# Patient Record
Sex: Male | Born: 1985 | Race: Black or African American | Hispanic: No | Marital: Single | Smoking: Never smoker
Health system: Southern US, Community
[De-identification: ages and names within clinical notes are randomized; demographics above are authoritative.]

## PROBLEM LIST (undated history)

## (undated) DIAGNOSIS — E119 Type 2 diabetes mellitus without complications: Secondary | ICD-10-CM

## (undated) DIAGNOSIS — G35 Multiple sclerosis: Secondary | ICD-10-CM

## (undated) DIAGNOSIS — R569 Unspecified convulsions: Secondary | ICD-10-CM

## (undated) DIAGNOSIS — R609 Edema, unspecified: Secondary | ICD-10-CM

## (undated) DIAGNOSIS — I10 Essential (primary) hypertension: Secondary | ICD-10-CM

## (undated) HISTORY — DX: Multiple sclerosis: G35

## (undated) HISTORY — PX: MOUTH SURGERY: SHX715

## (undated) HISTORY — DX: Type 2 diabetes mellitus without complications: E11.9

---
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
## 2003-07-17 ENCOUNTER — Emergency Department (HOSPITAL_COMMUNITY): Admission: EM | Admit: 2003-07-17 | Discharge: 2003-07-17 | Payer: Self-pay | Admitting: Emergency Medicine

## 2003-11-23 ENCOUNTER — Emergency Department (HOSPITAL_COMMUNITY): Admission: EM | Admit: 2003-11-23 | Discharge: 2003-11-23 | Payer: Self-pay | Admitting: Emergency Medicine

## 2004-02-09 ENCOUNTER — Emergency Department (HOSPITAL_COMMUNITY): Admission: EM | Admit: 2004-02-09 | Discharge: 2004-02-09 | Payer: Self-pay | Admitting: *Deleted

## 2004-03-11 ENCOUNTER — Emergency Department (HOSPITAL_COMMUNITY): Admission: EM | Admit: 2004-03-11 | Discharge: 2004-03-12 | Payer: Self-pay | Admitting: Emergency Medicine

## 2004-05-05 ENCOUNTER — Emergency Department (HOSPITAL_COMMUNITY): Admission: EM | Admit: 2004-05-05 | Discharge: 2004-05-05 | Payer: Self-pay | Admitting: Emergency Medicine

## 2004-06-07 ENCOUNTER — Emergency Department (HOSPITAL_COMMUNITY): Admission: EM | Admit: 2004-06-07 | Discharge: 2004-06-08 | Payer: Self-pay | Admitting: Emergency Medicine

## 2004-07-13 ENCOUNTER — Emergency Department (HOSPITAL_COMMUNITY): Admission: EM | Admit: 2004-07-13 | Discharge: 2004-07-13 | Payer: Self-pay | Admitting: Emergency Medicine

## 2004-09-20 ENCOUNTER — Emergency Department (HOSPITAL_COMMUNITY): Admission: EM | Admit: 2004-09-20 | Discharge: 2004-09-20 | Payer: Self-pay | Admitting: Emergency Medicine

## 2005-06-29 ENCOUNTER — Emergency Department (HOSPITAL_COMMUNITY): Admission: EM | Admit: 2005-06-29 | Discharge: 2005-06-29 | Payer: Self-pay | Admitting: Emergency Medicine

## 2005-08-14 ENCOUNTER — Emergency Department (HOSPITAL_COMMUNITY): Admission: EM | Admit: 2005-08-14 | Discharge: 2005-08-14 | Payer: Self-pay | Admitting: Emergency Medicine

## 2007-07-10 ENCOUNTER — Emergency Department (HOSPITAL_COMMUNITY): Admission: EM | Admit: 2007-07-10 | Discharge: 2007-07-10 | Payer: Self-pay | Admitting: Emergency Medicine

## 2012-07-04 ENCOUNTER — Encounter (HOSPITAL_COMMUNITY): Payer: Self-pay | Admitting: *Deleted

## 2012-07-04 ENCOUNTER — Emergency Department (HOSPITAL_COMMUNITY)
Admission: EM | Admit: 2012-07-04 | Discharge: 2012-07-04 | Disposition: A | Discharge status: Home / Self Care | Payer: Medicaid Other | Attending: Emergency Medicine | Admitting: Emergency Medicine

## 2012-07-04 DIAGNOSIS — R569 Unspecified convulsions: Secondary | ICD-10-CM

## 2012-07-04 DIAGNOSIS — Z79899 Other long term (current) drug therapy: Secondary | ICD-10-CM | POA: Insufficient documentation

## 2012-07-04 DIAGNOSIS — I10 Essential (primary) hypertension: Secondary | ICD-10-CM | POA: Insufficient documentation

## 2012-07-04 DIAGNOSIS — G40909 Epilepsy, unspecified, not intractable, without status epilepticus: Secondary | ICD-10-CM | POA: Insufficient documentation

## 2012-07-04 HISTORY — DX: Edema, unspecified: R60.9

## 2012-07-04 HISTORY — DX: Unspecified convulsions: R56.9

## 2012-07-04 HISTORY — DX: Essential (primary) hypertension: I10

## 2012-07-04 LAB — CBC WITH DIFFERENTIAL/PLATELET
Basophils Absolute: 0 10*3/uL (ref 0.0–0.1)
HCT: 45.4 % (ref 39.0–52.0)
Hemoglobin: 14.8 g/dL (ref 13.0–17.0)
Lymphocytes Relative: 58 % — ABNORMAL HIGH (ref 12–46)
Lymphs Abs: 4.9 10*3/uL — ABNORMAL HIGH (ref 0.7–4.0)
Monocytes Absolute: 0.6 10*3/uL (ref 0.1–1.0)
Monocytes Relative: 7 % (ref 3–12)
Neutro Abs: 2.8 10*3/uL (ref 1.7–7.7)
RBC: 5.48 MIL/uL (ref 4.22–5.81)
WBC: 8.6 10*3/uL (ref 4.0–10.5)

## 2012-07-04 LAB — VALPROIC ACID LEVEL: Valproic Acid Lvl: 31.2 ug/mL — ABNORMAL LOW (ref 50.0–100.0)

## 2012-07-04 LAB — BASIC METABOLIC PANEL
CO2: 17 mEq/L — ABNORMAL LOW (ref 19–32)
Chloride: 97 mEq/L (ref 96–112)
Creatinine, Ser: 1.24 mg/dL (ref 0.50–1.35)

## 2012-07-04 MED ORDER — DIVALPROEX SODIUM 500 MG PO DR TAB: 500.0000 mg | DELAYED_RELEASE_TABLET | Freq: Three times a day (TID) | ORAL | Status: DC

## 2012-07-04 NOTE — ED Provider Notes (Signed)
History  This chart was scribed for Donnetta Hutching, MD by Erskine Emery, ED Scribe. This patient was seen in room APA02/APA02 and the patient's care was started at 15:48.   CSN: [id number]  Arrival date & time 07/04/12  1538   First MD Initiated Contact with Patient 07/04/12 1548      Chief Complaint  Patient presents with  . Seizures    (Consider location/radiation/quality/duration/timing/severity/associated sxs/prior treatment) The history is provided by the patient and the spouse. No language interpreter was used.  Martin Stevenson is a 26 y.o. male who presents to the Emergency Department complaining of a seizure this afternoon, while sitting, witnessed by his father. Pt reports he is feeling sleepy now but is otherwise fine. Pt denies any rceent sickness or drinking alcohol. Pt has had a seizure disorder since he was 26 years old and has had about 15 seizures in the last 6 months. Pt was taking Keppra but was switched off it and on to Depakote about 2 months ago. 3 weeks ago the pt was admitted at [facility] for 3 days, where they put him on Depakote, which he takes x4/day, and another medication (the name of which he cannot remember). Pt has no other medical conditions. 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 Pt does not drink alcohol or smoke.   Pt's PCP is Dr. Robynn Pane in [place] and Dr. Gerilyn Pilgrim is the pt's neurologist. Pt has has an appointment on December 3rd with a new neurologist in [place] because the tests he is doing with Dr. Gerilyn Pilgrim are not revealing anything.  Past Medical History  Diagnosis Date  . Seizures   . Hypertension   . Fluid retention     Past Surgical History  Procedure Date  . Mouth surgery     No family history on file.  History  Substance Use Topics  . Smoking status: Never Smoker   . Smokeless tobacco: Not on file  . Alcohol Use: No      Review of Systems A complete 10 system review of  systems was obtained and all systems are negative except as noted in the HPI and PMH.    Allergies  Review of patient's allergies indicates no known allergies.  Home Medications  No current outpatient prescriptions on file.  Triage Vitals: BP 169/87  Temp 98.6 F (37 C) (Oral)  Resp 24  Ht 5\' 7"  (1.702 m)  Wt 218 lb (98.884 kg)  BMI 34.14 kg/m2  SpO2 100%  Physical Exam  Nursing note and vitals reviewed. Constitutional: He is oriented to person, place, and time. He appears well-developed and well-nourished.  HENT:  Head: Normocephalic and atraumatic.  Eyes: Conjunctivae normal and EOM are normal. Pupils are equal, round, and reactive to light.  Neck: Normal range of motion. Neck supple.  Cardiovascular: Normal rate, regular rhythm and normal heart sounds.   Pulmonary/Chest: Effort normal and breath sounds normal.  Abdominal: Soft. Bowel sounds are normal.  Musculoskeletal: Normal range of motion.  Neurological: He is alert and oriented to person, place, and time.  Skin: Skin is warm and dry.  Psychiatric: He has a normal mood and affect.    ED Course  Procedures (including critical care time) DIAGNOSTIC STUDIES: Oxygen Saturation is 100% on room air, normal by my interpretation.    COORDINATION OF CARE: 16:20--I evaluated the patient and we discussed a treatment plan including lab work to evaluate his Depakote levels, to which the pt and  his wife agreed. I told the pt we would call [facility] to get the name of the other medication he was put on.  Results for orders placed during the hospital encounter of 07/04/12  VALPROIC ACID LEVEL      Component Value Range   Valproic Acid Lvl 31.2 (*) 50.0 - 100.0 ug/mL  CBC WITH DIFFERENTIAL      Component Value Range   WBC 8.6  4.0 - 10.5 K/uL   RBC 5.48  4.22 - 5.81 MIL/uL   Hemoglobin 14.8  13.0 - 17.0 g/dL   HCT 16.1  09.6 - 04.5 %   MCV 82.8  78.0 - 100.0 fL   MCH 27.0  26.0 - 34.0 pg   MCHC 32.6  30.0 - 36.0 g/dL   RDW  40.9  81.1 - 91.4 %   Platelets 242  150 - 400 K/uL   Neutrophils Relative 33 (*) 43 - 77 %   Neutro Abs 2.8  1.7 - 7.7 K/uL   Lymphocytes Relative 58 (*) 12 - 46 %   Lymphs Abs 4.9 (*) 0.7 - 4.0 K/uL   Monocytes Relative 7  3 - 12 %   Monocytes Absolute 0.6  0.1 - 1.0 K/uL   Eosinophils Relative 3  0 - 5 %   Eosinophils Absolute 0.2  0.0 - 0.7 K/uL   Basophils Relative 0  0 - 1 %   Basophils Absolute 0.0  0.0 - 0.1 K/uL  BASIC METABOLIC PANEL      Component Value Range   Sodium 139  135 - 145 mEq/L   Potassium 3.4 (*) 3.5 - 5.1 mEq/L   Chloride 97  96 - 112 mEq/L   CO2 17 (*) 19 - 32 mEq/L   Glucose, Bld 126 (*) 70 - 99 mg/dL   BUN 14  6 - 23 mg/dL   Creatinine, Ser 7.82  0.50 - 1.35 mg/dL   Calcium 9.9  8.4 - 95.6 mg/dL   GFR calc non Af Amer 78 (*) >90 mL/min   GFR calc Af Amer >90  >90 mL/min       No diagnosis found.    MDM  Depakote dose is subtherapeutic. Patient is presently taking 500 mg 4 times a day. Will increase to 1000 mg on 1st and 2nd dose in day; and  500 mg dose #3 and #4 per day.  No seizure activity in emergency department     I personally performed the services described in this documentation, which was scribed in my presence. The recorded information has been reviewed and is accurate.    Donnetta Hutching, MD 07/04/12 1807

## 2012-07-04 NOTE — ED Notes (Signed)
Seizure today witnessed by pt's father.  Family reports pt was in sitting position at time of seizure.  Pt slightly postictal, moaning.  Able to follow commands.  C/o headache.

## 2012-07-04 NOTE — ED Notes (Signed)
Pt given ginger ale.

## 2013-01-17 ENCOUNTER — Ambulatory Visit: Payer: Self-pay | Admitting: Diagnostic Neuroimaging

## 2017-09-28 ENCOUNTER — Ambulatory Visit: Payer: Medicaid Other | Admitting: Neurology

## 2017-09-28 ENCOUNTER — Encounter: Payer: Self-pay | Admitting: Neurology

## 2017-09-28 VITALS — BP 124/86 | HR 78 | Ht 67.0 in | Wt 215.0 lb

## 2017-09-28 DIAGNOSIS — Z5181 Encounter for therapeutic drug level monitoring: Secondary | ICD-10-CM

## 2017-09-28 DIAGNOSIS — R569 Unspecified convulsions: Secondary | ICD-10-CM

## 2017-09-28 MED ORDER — LEVETIRACETAM 750 MG PO TABS: 750.0000 mg | ORAL_TABLET | Freq: Two times a day (BID) | ORAL | 3 refills | Status: DC

## 2017-09-28 NOTE — Patient Instructions (Signed)
   We will get an EEG study and we will increase the Keppra to 750 mg twice a day.

## 2017-09-28 NOTE — Progress Notes (Signed)
Reason for visit: Seizures  Referring physician: Dr. Karenann Cai Mancinas is a 32 y.o. male  History of present illness:  Martin Stevenson is a 32 year old right-handed black male with a history of seizures since he was 32 years old.  The patient denies any prior history of head trauma, he indicates that he has 2 maternal first cousins who also have seizures.  The patient indicates that his seizures have never been under good control, he has only gone about 6 weeks without a seizure.  He oftentimes has seizures every week.  The patient indicates that he may get a sensation of dj vu before the onset of the seizure and then he will either black out and jerk on all fours with tongue biting or bowel and bladder incontinence or he will have an episode where he will start cussing and give gestures with his arms.  The patient may have a headache following the event he may feel fatigued.  He does report some hand numbness but otherwise no numbness of extremities, he denies weakness of the extremities.  He has some slight gait instability, and he could not tolerate Dilantin because he became quite staggery on the medication.  He denies issues controlling the bowels or the bladder.  He is not sleeping well at night, he takes melatonin but he still does not rest well.  He claims that he did have MRI evaluation of the brain done at [facility].  He does not operate a motor vehicle.  He is currently not employed.  He is sent to this office for an evaluation.  He is on Keppra 500 mg twice daily and Depakote 500 mg daily.  Past Medical History:  Diagnosis Date  . Diabetes (HCC)   . Fluid retention   . Hypertension   . Seizures (HCC)     Past Surgical History:  Procedure Laterality Date  . MOUTH SURGERY     x3, jaw, hip, chin    Family History  Problem Relation Age of Onset  . Diabetes Mother   . Pulmonary embolism Mother        hx of blood clots  . Diabetes Father     Social history:   reports that  has never smoked. he has never used smokeless tobacco. He reports that he does not drink alcohol or use drugs.  Medications:  Prior to Admission medications   Medication Sig Start Date End Date Taking? Authorizing Provider  divalproex (DEPAKOTE) 500 MG DR tablet Take 1 tablet (500 mg total) by mouth 3 (three) times daily. Patient taking differently: Take 500 mg by mouth daily.  07/04/12  Yes Donnetta Hutching, MD  sitaGLIPtin-metformin (JANUMET) 50-1000 MG tablet Take 1 tablet by mouth 2 (two) times daily with a meal.   Yes [provider]  levETIRAcetam (KEPPRA) 750 MG tablet Take 1 tablet (750 mg total) by mouth 2 (two) times daily. 09/28/17   York Spaniel, MD     No Known Allergies  ROS:  Out of a complete 14 system review of symptoms, the patient complains only of the following symptoms, and all other reviewed systems are negative.  Snoring Diarrhea Moles Increased thirst, joint pain Dizziness, seizures Not enough sleep, racing thoughts Sleepiness  Blood pressure 124/86, pulse 78, height 5\' 7"  (1.702 m), weight 215 lb (97.5 kg).  Physical Exam  General: The patient is alert and cooperative at the time of the examination.  The patient is moderately obese.  Eyes: Pupils are equal,  round, and reactive to light. Discs are flat bilaterally.  Neck: The neck is supple, no carotid bruits are noted.  Respiratory: The respiratory examination is clear.  Cardiovascular: The cardiovascular examination reveals a regular rate and rhythm, no obvious murmurs or rubs are noted.  Skin: Extremities are without significant edema.  Neurologic Exam  Mental status: The patient is alert and oriented x 3 at the time of the examination. The patient has apparent normal recent and remote memory, with an apparently normal attention span and concentration ability.  Cranial nerves: Facial symmetry is present. There is good sensation of the face to pinprick and soft touch  bilaterally. The strength of the facial muscles and the muscles to head turning and shoulder shrug are normal bilaterally. Speech is well enunciated, no aphasia or dysarthria is noted. Extraocular movements are full. Visual fields are full. The tongue is midline, and the patient has symmetric elevation of the soft palate. No obvious hearing deficits are noted.  Motor: The motor testing reveals 5 over 5 strength of all 4 extremities. Good symmetric motor tone is noted throughout.  Sensory: Sensory testing is intact to pinprick, soft touch, vibration sensation, and position sense on all 4 extremities. No evidence of extinction is noted.  Coordination: Cerebellar testing reveals good finger-nose-finger and heel-to-shin bilaterally.  Gait and station: Gait is normal. Tandem gait is normal. Romberg is negative. No drift is seen.  Reflexes: Deep tendon reflexes are symmetric and normal bilaterally. Toes are downgoing bilaterally.   Assessment/Plan:  1.  Intractable seizure disorder  The patient continues to have very frequent seizures, he is on minimal doses of medication including Keppra and Depakote.  The patient will have blood work done today, he will have the Keppra dose increased to 750 mg twice daily, continue on the Depakote.  The patient will have an EEG study.  We will try to get the MRI brain result done at [facility].  Video EEG monitoring study may be done in the future if his seizures cannot come under good control.  He will follow-up in 3 months.  Marlan Palau MD 09/28/2017 2:28 PM  [facility] [address] [place], [place] [address]  Phone [phone] Fax [phone]

## 2017-09-29 ENCOUNTER — Telehealth: Payer: Self-pay | Admitting: Neurology

## 2017-09-29 DIAGNOSIS — R569 Unspecified convulsions: Secondary | ICD-10-CM

## 2017-09-29 LAB — COMPREHENSIVE METABOLIC PANEL
A/G RATIO: 1.4 (ref 1.2–2.2)
ALBUMIN: 4.3 g/dL (ref 3.5–5.5)
ALT: 23 IU/L (ref 0–44)
AST: 17 IU/L (ref 0–40)
Alkaline Phosphatase: 47 IU/L (ref 39–117)
BUN / CREAT RATIO: 14 (ref 9–20)
BUN: 12 mg/dL (ref 6–20)
Bilirubin Total: <0.2 mg/dL (ref 0.0–1.2)
CALCIUM: 9.6 mg/dL (ref 8.7–10.2)
CO2: 22 mmol/L (ref 20–29)
Chloride: 104 mmol/L (ref 96–106)
Creatinine, Ser: 0.85 mg/dL (ref 0.76–1.27)
GFR calc Af Amer: 132 mL/min/{1.73_m2} (ref >59)
GFR calc non Af Amer: 114 mL/min/{1.73_m2} (ref >59)
GLOBULIN, TOTAL: 3.1 g/dL (ref 1.5–4.5)
Glucose: 117 mg/dL — ABNORMAL HIGH (ref 65–99)
POTASSIUM: 4.4 mmol/L (ref 3.5–5.2)
SODIUM: 142 mmol/L (ref 134–144)
Total Protein: 7.4 g/dL (ref 6.0–8.5)

## 2017-09-29 LAB — CBC WITH DIFFERENTIAL/PLATELET
Basophils Absolute: 0 10*3/uL (ref 0.0–0.2)
Basos: 0 %
EOS (ABSOLUTE): 0.1 10*3/uL (ref 0.0–0.4)
Eos: 1 %
Hematocrit: 41.5 % (ref 37.5–51.0)
Hemoglobin: 13.8 g/dL (ref 13.0–17.7)
Immature Grans (Abs): 0 10*3/uL (ref 0.0–0.1)
Immature Granulocytes: 0 %
LYMPHS ABS: 2.6 10*3/uL (ref 0.7–3.1)
Lymphs: 51 %
MCH: 26.7 pg (ref 26.6–33.0)
MCHC: 33.3 g/dL (ref 31.5–35.7)
MCV: 80 fL (ref 79–97)
MONOS ABS: 0.4 10*3/uL (ref 0.1–0.9)
Monocytes: 7 %
NEUTROS ABS: 2.1 10*3/uL (ref 1.4–7.0)
NEUTROS PCT: 41 %
PLATELETS: 241 10*3/uL (ref 150–379)
RBC: 5.16 x10E6/uL (ref 4.14–5.80)
RDW: 14.8 % (ref 12.3–15.4)
WBC: 5.3 10*3/uL (ref 3.4–10.8)

## 2017-09-29 LAB — AMMONIA: Ammonia: 73 ug/dL (ref 27–102)

## 2017-09-29 LAB — LEVETIRACETAM LEVEL: Levetiracetam Lvl: 8.1 ug/mL — ABNORMAL LOW (ref 10.0–40.0)

## 2017-09-29 LAB — VALPROIC ACID LEVEL: Valproic Acid Lvl: 39 ug/mL — ABNORMAL LOW (ref 50–100)

## 2017-09-29 NOTE — Telephone Encounter (Signed)
I called [facility] back. Advised we would like CT head report. She needs [place] to re-send release form asking for the CT head.

## 2017-09-29 NOTE — Telephone Encounter (Signed)
I called the patient.  The blood work is unremarkable except that the Keppra level and the Depakote level are both low.  The Keppra dose was just recently increased, the patient continues to have seizures, he will call back and we will increase the Depakote dosing as well.  [facility] records indicate that the patient has never had an MRI of the brain.  The patient will be set up for MRI with and without gadolinium enhancement.

## 2017-09-29 NOTE — Telephone Encounter (Signed)
[facility] [phone] called she rec'd medical release for MRI report. Patient's not had MRI just a CT head 12/04/14, abdomen and pelvis on 08/18/17. Please call to advise

## 2017-09-29 NOTE — Telephone Encounter (Signed)
Recall to get MRI results from your head hospital, the patient apparently has never had an MRI done of the brain as he indicated that he did.  The patient had CT scan of the brain that was done on 04 December 2014.  This study was normal.  The study was done without contrast only.

## 2017-10-07 ENCOUNTER — Other Ambulatory Visit: Payer: Self-pay

## 2017-10-07 ENCOUNTER — Other Ambulatory Visit: Payer: Medicaid Other

## 2017-10-14 ENCOUNTER — Other Ambulatory Visit: Payer: Self-pay

## 2017-10-14 ENCOUNTER — Ambulatory Visit
Admission: RE | Admit: 2017-10-14 | Discharge: 2017-10-14 | Disposition: A | Discharge status: Home / Self Care | Payer: Medicaid Other | Source: Ambulatory Visit | Attending: Neurology | Admitting: Neurology

## 2017-10-14 DIAGNOSIS — R569 Unspecified convulsions: Secondary | ICD-10-CM

## 2017-10-14 MED ORDER — GADOBENATE DIMEGLUMINE 529 MG/ML IV SOLN
20.0000 mL | Freq: Once | INTRAVENOUS | Status: AC | PRN
  Administered 2017-10-14: 20 mL via INTRAVENOUS

## 2017-10-15 ENCOUNTER — Telehealth: Payer: Self-pay | Admitting: Neurology

## 2017-10-15 DIAGNOSIS — G35 Multiple sclerosis: Secondary | ICD-10-CM

## 2017-10-15 NOTE — Telephone Encounter (Signed)
I called the patient.  MRI of the brain shows findings that are typical for multiple sclerosis.  Thing to do with his seizures per se.  The patient will be set up for MRI of the cervical spine, if this is unremarkable, the patient will go for lumbar puncture.   MRI brain 10/15/17:  IMPRESSION:   Abnormal MRI brain (with and without) demonstrating: 1. There are 6 ovoid and round periventricular and subcortical T2 hyperintense lesions in the right frontal and bilateral parietal regions. Considerations include demyelinating, autoimmune, inflammatory, post-infectious or chronic ischemic etiologies.  2. No abnormal enhancing lesions. 3. No acute findings.

## 2017-10-19 ENCOUNTER — Telehealth: Payer: Self-pay | Admitting: Neurology

## 2017-10-19 NOTE — Telephone Encounter (Signed)
Mri order was sent to [facility]. It's [employer] [facility] obtains the authorization.

## 2017-10-20 ENCOUNTER — Other Ambulatory Visit: Payer: Medicaid Other

## 2017-10-20 ENCOUNTER — Encounter: Payer: Self-pay | Admitting: Neurology

## 2017-10-28 ENCOUNTER — Ambulatory Visit
Admission: RE | Admit: 2017-10-28 | Discharge: 2017-10-28 | Disposition: A | Discharge status: Home / Self Care | Payer: Medicaid Other | Source: Ambulatory Visit | Attending: Neurology | Admitting: Neurology

## 2017-10-28 DIAGNOSIS — G35 Multiple sclerosis: Secondary | ICD-10-CM | POA: Diagnosis not present

## 2017-10-28 MED ORDER — GADOBENATE DIMEGLUMINE 529 MG/ML IV SOLN
20.0000 mL | Freq: Once | INTRAVENOUS | Status: AC | PRN
  Administered 2017-10-28: 20 mL via INTRAVENOUS

## 2017-10-29 ENCOUNTER — Telehealth: Payer: Self-pay | Admitting: Neurology

## 2017-10-29 DIAGNOSIS — R9089 Other abnormal findings on diagnostic imaging of central nervous system: Secondary | ICD-10-CM

## 2017-10-29 NOTE — Telephone Encounter (Signed)
I called the patient.  The MRI of the cervical spine is completely normal.  Given the abnormalities seen by MRI of the brain, we will set up a lumbar puncture.  I will also do a visual evoked response test looking for changes consistent with multiple sclerosis.     MRI cervical 10/29/17:  IMPRESSION:   Normal MRI cervical spine (with and without).  No intrinsic, compressive or abnormal enhancing spinal cord lesions.

## 2017-11-03 ENCOUNTER — Telehealth: Payer: Self-pay | Admitting: *Deleted

## 2017-11-03 ENCOUNTER — Ambulatory Visit
Admission: RE | Admit: 2017-11-03 | Discharge: 2017-11-03 | Disposition: A | Discharge status: Home / Self Care | Payer: Medicaid Other | Source: Ambulatory Visit | Attending: Neurology | Admitting: Neurology

## 2017-11-03 VITALS — BP 134/77 | HR 72

## 2017-11-03 DIAGNOSIS — R569 Unspecified convulsions: Secondary | ICD-10-CM

## 2017-11-03 DIAGNOSIS — R9089 Other abnormal findings on diagnostic imaging of central nervous system: Secondary | ICD-10-CM

## 2017-11-03 NOTE — Telephone Encounter (Signed)
Received results via fax. Gave to CW,MD to review.

## 2017-11-03 NOTE — Telephone Encounter (Signed)
Preliminary spinal fluid results have been reviewed.  The patient is undergoing a demyelinating disease workup.

## 2017-11-03 NOTE — Discharge Instructions (Signed)

## 2017-11-03 NOTE — Progress Notes (Signed)
One SST tube of blood drawn from right [facility] space for LP labs without difficulty; site unremarkable.

## 2017-11-03 NOTE — Telephone Encounter (Signed)
Took call from phone staff. Spoke with Thereasa Distance w/ [facility]. He will fax results for stat labs to [phone]. Advised once received, I will give to Dr. Anne Hahn to review. He verbalize understanding and should be faxed to [place] within 10-15 min

## 2017-11-13 ENCOUNTER — Telehealth: Payer: Self-pay | Admitting: Neurology

## 2017-11-13 DIAGNOSIS — R9089 Other abnormal findings on diagnostic imaging of central nervous system: Secondary | ICD-10-CM

## 2017-11-13 LAB — ANGIOTENSIN CONVERTING ENZYME, CSF: ACE, CSF: 9 U/L (ref <=15)

## 2017-11-13 LAB — CSF CELL COUNT WITH DIFFERENTIAL
RBC COUNT CSF: 0 {cells}/uL (ref 0–10)
WBC, CSF: 4 cells/uL (ref 0–5)

## 2017-11-13 LAB — OLIGOCLONAL BANDS, CSF + SERM

## 2017-11-13 LAB — GLUCOSE, CSF: Glucose, CSF: 65 mg/dL (ref 40–80)

## 2017-11-13 LAB — PROTEIN, CSF: TOTAL PROTEIN, CSF: 22 mg/dL (ref 15–45)

## 2017-11-13 LAB — VDRL, CSF: SYPHILIS VDRL QUANT CSF: NONREACTIVE

## 2017-11-13 NOTE — Telephone Encounter (Signed)
I called the patient.  The spinal fluid results show 5 oligoclonal bands but these bands are also present in the peripheral blood.  This is not diagnostic of multiple sclerosis.  I will have the patient come in for further blood work.  We will need to follow the MRI of the brain, we will recheck in 6 months.

## 2017-11-13 NOTE — Telephone Encounter (Signed)
I called the pathologist who read the spinal fluid analysis.  It appears that the patient has both evidence of greater than 5 oligoclonal bands produced only within the central nervous system, but he also has additional bands that are both in the peripheral blood and CNS.  The above findings could be consistent with multiple sclerosis therefore.  We will get the blood work, we may consider initiation of treatment for multiple sclerosis with this patient.

## 2017-11-30 ENCOUNTER — Encounter: Payer: Self-pay | Admitting: Neurology

## 2017-11-30 ENCOUNTER — Other Ambulatory Visit: Payer: Medicaid Other

## 2017-12-16 ENCOUNTER — Encounter: Payer: Self-pay | Admitting: Neurology

## 2017-12-18 ENCOUNTER — Other Ambulatory Visit: Payer: Medicaid Other

## 2017-12-21 ENCOUNTER — Ambulatory Visit: Payer: Medicaid Other | Admitting: Neurology

## 2017-12-21 ENCOUNTER — Telehealth: Payer: Self-pay | Admitting: Neurology

## 2017-12-21 DIAGNOSIS — R569 Unspecified convulsions: Secondary | ICD-10-CM | POA: Diagnosis not present

## 2017-12-21 NOTE — Telephone Encounter (Signed)
I called the patient.  The EEG study is very abnormal, shows significant irritability emanating from the right frontotemporal area.   If this seizures cannot be controlled with medications alone, this patient could potentially be an excellent candidate for epilepsy surgery.  His MRI of the brain however shows changes that are consistent with multiple sclerosis.  He will be seen in the office next week, we will discuss initiation of treatment for multiple sclerosis, lumbar puncture findings were consistent with a diagnosis of MS as well.

## 2017-12-21 NOTE — Procedures (Signed)
     History: Martin Stevenson is a 32 year old gentleman with a history of seizures since he was 32 years old.  The patient has intractable seizures, never going more than 6 weeks without an event.  The patient has a sensation of dj vu before onset of seizure and he will black out and jerk on all fours.  The patient is being evaluated for these episodes.  This is a routine EEG.  No skull defects are noted.  Medications include Depakote, Janumet, and Keppra.  EEG classification: Dysrhythmia grade 3 right frontotemporal  Description of the recording: The background rhythms of this recording consists of a well modulated medium amplitude alpha rhythm of 11 Hz that is reactive to eye opening and closure.  As the record progresses, photic stimulation is performed, this resulted in a minimal but bilateral photic driving response.  Hyperventilation is not performed.  Throughout the recording, frequent sharp transients are seen emanating from the right frontotemporal area primarily, occasional spike-wave discharges are noted frontally on the right.  An electrographic seizure was not recorded.  EKG monitor shows no evidence of cardiac rhythm abnormalities with a heart rate of 66.  Impression: This is an abnormal EEG recording secondary to sharp wave activity and spike wave activity emanating from the right frontotemporal region.  This study suggests the diagnosis of epilepsy, it suggest that the focus is in the right brain.  No electrographic seizures were seen.

## 2017-12-22 ENCOUNTER — Telehealth: Payer: Self-pay

## 2017-12-22 ENCOUNTER — Telehealth: Payer: Self-pay | Admitting: Neurology

## 2017-12-22 NOTE — Telephone Encounter (Signed)
Called patient multiple times to r/s VER testing. Patient's voicemail was full so I couldn't leave him a voicemail to r/s. Sent patient a r/s letter.

## 2017-12-22 NOTE — Telephone Encounter (Signed)
Error

## 2017-12-29 ENCOUNTER — Encounter: Payer: Self-pay | Admitting: Neurology

## 2017-12-29 ENCOUNTER — Ambulatory Visit: Payer: Medicaid Other | Admitting: Neurology

## 2017-12-29 VITALS — BP 129/85 | HR 80 | Ht 67.0 in | Wt 207.0 lb

## 2017-12-29 DIAGNOSIS — G35 Multiple sclerosis: Secondary | ICD-10-CM | POA: Diagnosis not present

## 2017-12-29 DIAGNOSIS — G35D Multiple sclerosis, unspecified: Secondary | ICD-10-CM

## 2017-12-29 DIAGNOSIS — R569 Unspecified convulsions: Secondary | ICD-10-CM

## 2017-12-29 DIAGNOSIS — Z5181 Encounter for therapeutic drug level monitoring: Secondary | ICD-10-CM | POA: Diagnosis not present

## 2017-12-29 HISTORY — DX: Multiple sclerosis: G35

## 2017-12-29 HISTORY — DX: Multiple sclerosis, unspecified: G35.D

## 2017-12-29 MED ORDER — DIVALPROEX SODIUM 500 MG PO DR TAB: 500.0000 mg | DELAYED_RELEASE_TABLET | Freq: Two times a day (BID) | ORAL | 1 refills | Status: DC

## 2017-12-29 NOTE — Progress Notes (Signed)
Placed [facility] lab in [facility] lock box for routine pick up. Also faxed completed/signed start form for Tecfidera to [employer] at [phone]. Received fax confirmation.

## 2017-12-29 NOTE — Progress Notes (Signed)
Reason for visit: Seizures, multiple sclerosis Mr. Martin Stevenson is a 32 year old  Martin Stevenson is an 32 y.o. male  History of present illness:  Mr. Martin Stevenson is a 32 year old right-handed black male with a history of intractable seizures.  The patient has been on low-dose Depakote taking only 500 mg daily, he takes Keppra 750 mg twice daily.  He tolerates the medications well, but his seizures have not been under full control.  He is not able to operate a motor vehicle, he has not been able to maintain gainful employment because of the seizures.  The patient last had a seizure about 1 week prior to this visit.  Usually after the seizure he has soreness of the left shoulder and arm.  He is also noted some achy sensations in both legs that is better with walking, and is worse on the right leg, he claims that this started following the lumbar puncture.  The patient denies any problems controlling the bowels or the bladder, he has not had any balance issues or falls.  The patient returns to this office for an evaluation.  When last seen, MRI of the brain was done and showed lesions consistent with multiple sclerosis, lumbar puncture showed oligoclonal banding consistent with a diagnosis of MS.  MRI of the cervical spine did not show cord lesions, a visual evoked response test was ordered but has not yet been done.  The patient is not yet on any disease modifying agents for multiple sclerosis.  Past Medical History:  Diagnosis Date  . Diabetes (HCC)   . Fluid retention   . Hypertension   . Seizures (HCC)     Past Surgical History:  Procedure Laterality Date  . MOUTH SURGERY     x3, jaw, hip, chin    Family History  Problem Relation Age of Onset  . Diabetes Mother   . Pulmonary embolism Mother        hx of blood clots  . Diabetes Father     Social history:  reports that he has never smoked. He has never used smokeless tobacco. He reports that he does not drink alcohol or use drugs.   No Known  Allergies  Medications:  Prior to Admission medications   Medication Sig Start Date End Date Taking? Authorizing Provider  divalproex (DEPAKOTE) 500 MG DR tablet Take 1 tablet (500 mg total) by mouth 3 (three) times daily. Patient taking differently: Take 500 mg by mouth daily.  07/04/12  Yes Donnetta Hutching, MD  levETIRAcetam (KEPPRA) 750 MG tablet Take 1 tablet (750 mg total) by mouth 2 (two) times daily. 09/28/17  Yes York Spaniel, MD  sitaGLIPtin-metformin (JANUMET) 50-1000 MG tablet Take 1 tablet by mouth 2 (two) times daily with a meal.   Yes [provider]    ROS:  Out of a complete 14 system review of symptoms, the patient complains only of the following symptoms, and all other reviewed systems are negative.  Joint pain, joint swelling, walking difficulty Seizures  There were no vitals taken for this visit.  Physical Exam  General: The patient is alert and cooperative at the time of the examination.  Skin: No significant peripheral edema is noted.   Neurologic Exam  Mental status: The patient is alert and oriented x 3 at the time of the examination. The patient has apparent normal recent and remote memory, with an apparently normal attention span and concentration ability.   Cranial nerves: Facial symmetry is present. Speech is normal, no  aphasia or dysarthria is noted. Extraocular movements are full. Visual fields are full.  Pupils are equal, round, and reactive to light.  Discs are flat bilaterally.  Motor: The patient has good strength in all 4 extremities.  Sensory examination: Soft touch sensation is symmetric on the face, arms, and legs.  Coordination: The patient has good finger-nose-finger and heel-to-shin bilaterally.  Gait and station: The patient has a normal gait. Tandem gait is normal. Romberg is negative. No drift is seen.  Reflexes: Deep tendon reflexes are symmetric.   MRI brain 10/15/17:  IMPRESSION:   Abnormal MRI brain (with and  without) demonstrating: 1. There are 6 ovoid and round periventricular and subcortical T2 hyperintense lesions in the right frontal and bilateral parietal regions. Considerations include demyelinating, autoimmune, inflammatory, post-infectious or chronic ischemic etiologies.  2. No abnormal enhancing lesions. 3. No acute findings.  * MRI scan images were reviewed online. I agree with the written report.   MRI cervical 10/29/17:  IMPRESSION:   Normal MRI cervical spine (with and without). No intrinsic, compressive or abnormal enhancing spinal cord lesions.   EEG 12/21/17:  Impression: This is an abnormal EEG recording secondary to sharp wave activity and spike wave activity emanating from the right frontotemporal region.  This study suggests the diagnosis of epilepsy, it suggest that the focus is in the right brain.  No electrographic seizures were seen.    Assessment/Plan:  1.  Intractable seizures  2.  Multiple sclerosis  The patient will have blood work done today.  The Depakote dosing will be increased to 500 mg twice daily, Keppra will be maintained at 750 mg twice daily.  He will be placed on Tecfidera in the next week or 2.  The patient will follow-up in 4 months.  Marlan Palau MD 12/29/2017 11:16 AM  [facility] [address] [place], [place] [address]  Phone [phone] Fax [phone]

## 2018-01-02 LAB — HEPATITIS B CORE ANTIBODY, TOTAL: Hep B Core Total Ab: NEGATIVE

## 2018-01-02 LAB — QUANTIFERON-TB GOLD PLUS
QUANTIFERON NIL VALUE: 0.02 [IU]/mL
QUANTIFERON-TB GOLD PLUS: NEGATIVE
QuantiFERON Mitogen Value: >10 IU/mL
QuantiFERON TB1 Ag Value: 0.05 IU/mL
QuantiFERON TB2 Ag Value: 0.04 IU/mL

## 2018-01-02 LAB — COMPREHENSIVE METABOLIC PANEL
ALBUMIN: 4.5 g/dL (ref 3.5–5.5)
ALK PHOS: 46 IU/L (ref 39–117)
ALT: 18 IU/L (ref 0–44)
AST: 16 IU/L (ref 0–40)
Albumin/Globulin Ratio: 1.6 (ref 1.2–2.2)
BILIRUBIN TOTAL: 0.3 mg/dL (ref 0.0–1.2)
BUN/Creatinine Ratio: 12 (ref 9–20)
BUN: 12 mg/dL (ref 6–20)
CHLORIDE: 106 mmol/L (ref 96–106)
CO2: 23 mmol/L (ref 20–29)
CREATININE: 1.04 mg/dL (ref 0.76–1.27)
Calcium: 9.9 mg/dL (ref 8.7–10.2)
GFR calc Af Amer: 109 mL/min/{1.73_m2} (ref >59)
GFR calc non Af Amer: 94 mL/min/{1.73_m2} (ref >59)
GLUCOSE: 109 mg/dL — AB (ref 65–99)
Globulin, Total: 2.8 g/dL (ref 1.5–4.5)
Potassium: 4.4 mmol/L (ref 3.5–5.2)
Sodium: 145 mmol/L — ABNORMAL HIGH (ref 134–144)
TOTAL PROTEIN: 7.3 g/dL (ref 6.0–8.5)

## 2018-01-02 LAB — CBC WITH DIFFERENTIAL/PLATELET
BASOS ABS: 0 10*3/uL (ref 0.0–0.2)
Basos: 0 %
EOS (ABSOLUTE): 0.1 10*3/uL (ref 0.0–0.4)
EOS: 3 %
HEMATOCRIT: 43.1 % (ref 37.5–51.0)
HEMOGLOBIN: 13.6 g/dL (ref 13.0–17.7)
IMMATURE GRANS (ABS): 0 10*3/uL (ref 0.0–0.1)
IMMATURE GRANULOCYTES: 0 %
Lymphocytes Absolute: 2.2 10*3/uL (ref 0.7–3.1)
Lymphs: 43 %
MCH: 26.1 pg — ABNORMAL LOW (ref 26.6–33.0)
MCHC: 31.6 g/dL (ref 31.5–35.7)
MCV: 83 fL (ref 79–97)
MONOCYTES: 8 %
Monocytes Absolute: 0.4 10*3/uL (ref 0.1–0.9)
Neutrophils Absolute: 2.3 10*3/uL (ref 1.4–7.0)
Neutrophils: 46 %
Platelets: 271 10*3/uL (ref 150–450)
RBC: 5.21 x10E6/uL (ref 4.14–5.80)
RDW: 14.9 % (ref 12.3–15.4)
WBC: 5.1 10*3/uL (ref 3.4–10.8)

## 2018-01-02 LAB — VALPROIC ACID LEVEL: VALPROIC ACID LVL: 38 ug/mL — AB (ref 50–100)

## 2018-01-02 LAB — HEPATITIS C ANTIBODY: Hep C Virus Ab: <0.1 s/co ratio (ref 0.0–0.9)

## 2018-01-02 LAB — LEVETIRACETAM LEVEL: LEVETIRACETAM: 18.5 ug/mL (ref 10.0–40.0)

## 2018-01-02 LAB — VARICELLA ZOSTER ANTIBODY, IGG: VARICELLA: 986 {index} (ref >165)

## 2018-01-05 ENCOUNTER — Telehealth: Payer: Self-pay | Admitting: *Deleted

## 2018-01-05 NOTE — Telephone Encounter (Signed)
-----   Message from York Spaniel, MD sent at 01/04/2018 11:26 AM EDT ----- Blood work is unremarkable, with exception of mildly elevated sodium level, not clinically significant.  Valproic acid level is low, dosing has been increased.  Keppra level is therapeutic.  JC virus antibody level is pending. Please call the patient. ----- Message ----- From: Nell [facility] Lab Results In Sent: 12/30/2017   7:42 AM To: York Spaniel, MD

## 2018-01-05 NOTE — Telephone Encounter (Signed)
Called and spoke w/ patient. Advised labs unremarkable w/ exception of mildly elevated sodium level, not clinically significant. Valproic acid level low, but dose increased. Keppra level in therapeutic range. JCV pending. We will call once those results are in. He verbalized understanding.

## 2018-01-06 ENCOUNTER — Telehealth: Payer: Self-pay | Admitting: Neurology

## 2018-01-06 NOTE — Telephone Encounter (Signed)
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

## 2018-01-14 ENCOUNTER — Telehealth: Payer: Self-pay | Admitting: *Deleted

## 2018-01-14 NOTE — Telephone Encounter (Addendum)
I reached out to [employer] at [phone] and spoke with Denny Peon. They verified they received patient start form for Tecfidera. They spoke w/ pt on 01/01/18 about next steps. It was sent the [facility] on 01/08/18. She does not see any other updates. They will contact the pt to receive an update and see if copay assistance is needed. They will also contact pharmacy to see where they are at and if anything is needed. They will call back to let us know.

## 2018-01-26 NOTE — Telephone Encounter (Signed)
I called [employer] again and spoke with Crystal to receive update on pt receiving rx Tecfidera from specialty pharmacy. States specialty pharmacy ready to shop medication to pt and he has 3.00 copay. They have pt assistance options if needed.   They have been unable to contact him to verify shipment info. They reached out to him 01/18/18, 01/21/18, and 01/25/18. Advised I will try to reach patient to have him call them to set up shipment as well.  I called and LVM (ok per DPR) on [phone] with above information for him to call and set up shipment.

## 2018-01-26 NOTE — Telephone Encounter (Signed)
Noted, thank you

## 2018-01-26 NOTE — Telephone Encounter (Signed)
Pt returned RN's call. Msg relayed, he will call the specialty pharmacy today.

## 2018-01-26 NOTE — Telephone Encounter (Signed)
Tried calling fiance on DPR but went to VM. Did not LVM since no permission to on form.  I tried father on DPR Mal Amabile). Was able to speak with him. He will contact his son to have him call our office to get information re: Tecfidera shipment.   If pt calls, please tell him to call the specialty pharmacy at [phone] to set up shipment for Tecfidera.

## 2018-02-01 NOTE — Telephone Encounter (Signed)
Received fax transmission from [employer] stating, "[facility] has confirmed that your patient's Tecfidera prescription has been shipped."   Phone: [phone] for any questions or concerns.

## 2018-03-10 ENCOUNTER — Ambulatory Visit: Payer: Medicaid Other | Admitting: "Endocrinology

## 2018-03-23 ENCOUNTER — Other Ambulatory Visit: Payer: Self-pay | Admitting: Neurology

## 2018-04-27 ENCOUNTER — Encounter: Payer: Self-pay | Admitting: "Endocrinology

## 2018-04-27 ENCOUNTER — Ambulatory Visit (INDEPENDENT_AMBULATORY_CARE_PROVIDER_SITE_OTHER): Payer: Medicaid Other | Admitting: "Endocrinology

## 2018-04-27 VITALS — BP 135/86 | HR 75 | Ht 67.0 in | Wt 206.0 lb

## 2018-04-27 DIAGNOSIS — E1165 Type 2 diabetes mellitus with hyperglycemia: Secondary | ICD-10-CM

## 2018-04-27 NOTE — Progress Notes (Signed)
Endocrinology Consult Note       04/27/2018, 1:38 PM   Subjective:    Patient ID: Martin Stevenson, male    DOB: 02-06-1986.  Martin Stevenson is being seen in consultation for management of currently uncontrolled symptomatic diabetes requested by  Avon Gully, MD.   Past Medical History:  Diagnosis Date  . Diabetes (HCC)   . Fluid retention   . Hypertension   . Multiple sclerosis (HCC) 12/29/2017  . Seizures (HCC)    Past Surgical History:  Procedure Laterality Date  . MOUTH SURGERY     x3, jaw, hip, chin   Social History   Socioeconomic History  . Marital status: Single    Spouse name: Not on file  . Number of children: 3  . Years of education: HS  . Highest education level: Not on file  Occupational History    Comment: Unemployed  Social Needs  . Financial resource strain: Not on file  . Food insecurity:    Worry: Not on file    Inability: Not on file  . Transportation needs:    Medical: Not on file    Non-medical: Not on file  Tobacco Use  . Smoking status: Never Smoker  . Smokeless tobacco: Never Used  Substance and Sexual Activity  . Alcohol use: No    Comment: Quit: 2012  . Drug use: No  . Sexual activity: Not on file  Lifestyle  . Physical activity:    Days per week: Not on file    Minutes per session: Not on file  . Stress: Not on file  Relationships  . Social connections:    Talks on phone: Not on file    Gets together: Not on file    Attends religious service: Not on file    Active member of club or organization: Not on file    Attends meetings of clubs or organizations: Not on file    Relationship status: Not on file  Other Topics Concern  . Not on file  Social History Narrative   Pt lives at home with his spouse.   Caffeine Use: very little   Right handed    Outpatient Encounter Medications as of 04/27/2018  Medication Sig  . divalproex (DEPAKOTE) 500 MG DR  tablet Take 1 tablet (500 mg total) by mouth 2 (two) times daily.  Marland Kitchen levETIRAcetam (KEPPRA) 750 MG tablet TAKE 1 TABLET BY MOUTH TWICE DAILY  . sitaGLIPtin-metformin (JANUMET) 50-1000 MG tablet Take 1 tablet by mouth 2 (two) times daily with a meal.  . TECFIDERA 240 MG CPDR Take 1 capsule by mouth 2 (two) times daily.   No facility-administered encounter medications on file as of 04/27/2018.     ALLERGIES: Allergies  Allergen Reactions  . Shellfish Allergy     VACCINATION STATUS:  There is no immunization history on file for this patient.  Diabetes  He presents for his initial diabetic visit. He has type 2 diabetes mellitus. Onset time: He was diagnosed at approximate age of 24 years. His disease course has been stable. There are no hypoglycemic associated symptoms. Pertinent negatives for hypoglycemia include no confusion, headaches, pallor  or seizures. There are no diabetic associated symptoms. Pertinent negatives for diabetes include no chest pain, no fatigue, no polydipsia, no polyphagia, no polyuria and no weakness. There are no hypoglycemic complications. Symptoms are stable. There are no diabetic complications. Risk factors for coronary artery disease include diabetes mellitus, male sex, obesity and sedentary lifestyle. Current diabetic treatments: He is on Janumet 50/1000 mg p.o. twice daily. His weight is fluctuating minimally. He is following a generally unhealthy diet. When asked about meal planning, he reported none. He has not had a previous visit with a dietitian. He rarely participates in exercise. (He did not bring any meter nor logs to review today. No recent A1c to review.) An ACE inhibitor/angiotensin II receptor blocker is not being taken. He does not see a podiatrist.Eye exam is not current.      Review of Systems  Constitutional: Negative for chills, fatigue, fever and unexpected weight change.  HENT: Negative for dental problem, mouth sores and trouble swallowing.    Eyes: Negative for visual disturbance.  Respiratory: Negative for cough, choking, chest tightness, shortness of breath and wheezing.   Cardiovascular: Negative for chest pain, palpitations and leg swelling.  Gastrointestinal: Negative for abdominal distention, abdominal pain, constipation, diarrhea, nausea and vomiting.  Endocrine: Negative for polydipsia, polyphagia and polyuria.  Genitourinary: Negative for dysuria, flank pain, hematuria and urgency.  Musculoskeletal: Negative for back pain, gait problem, myalgias and neck pain.  Skin: Negative for pallor, rash and wound.  Neurological: Negative for seizures, syncope, weakness, numbness and headaches.  Psychiatric/Behavioral: Negative for confusion and dysphoric mood.    Objective:    BP 135/86   Pulse 75   Ht 5\' 7"  (1.702 m)   Wt 206 lb (93.4 kg)   BMI 32.26 kg/m   Wt Readings from Last 3 Encounters:  04/27/18 206 lb (93.4 kg)  12/29/17 207 lb (93.9 kg)  09/28/17 215 lb (97.5 kg)     Physical Exam  Constitutional: He is oriented to person, place, and time. He appears well-developed and well-nourished. He is cooperative. No distress.  HENT:  Head: Normocephalic and atraumatic.  Eyes: EOM are normal.  Neck: Normal range of motion. Neck supple. No tracheal deviation present. No thyromegaly present.  Cardiovascular: Normal rate, S1 normal, S2 normal and normal heart sounds. Exam reveals no gallop.  No murmur heard. Pulses:      Dorsalis pedis pulses are 1+ on the right side, and 1+ on the left side.       Posterior tibial pulses are 1+ on the right side, and 1+ on the left side.  Pulmonary/Chest: Breath sounds normal. No respiratory distress. He has no wheezes.  Abdominal: Soft. Bowel sounds are normal. He exhibits no distension. There is no tenderness. There is no guarding and no CVA tenderness.  Musculoskeletal: He exhibits no edema.       Right shoulder: He exhibits no swelling and no deformity.  Neurological: He is alert  and oriented to person, place, and time. He has normal strength and normal reflexes. No cranial nerve deficit or sensory deficit. Gait normal.  Skin: Skin is warm and dry. No rash noted. No cyanosis. Nails show no clubbing.  Psychiatric: He has a normal mood and affect. His speech is normal and behavior is normal. Judgment and thought content normal. Cognition and memory are normal.     CMP ( most recent) CMP     Component Value Date/Time   NA 145 (H) 12/29/2017 1145   K 4.4 12/29/2017 1145  CL 106 12/29/2017 1145   CO2 23 12/29/2017 1145   GLUCOSE 109 (H) 12/29/2017 1145   GLUCOSE 126 (H) 07/04/2012 1555   BUN 12 12/29/2017 1145   CREATININE 1.04 12/29/2017 1145   CALCIUM 9.9 12/29/2017 1145   PROT 7.3 12/29/2017 1145   ALBUMIN 4.5 12/29/2017 1145   AST 16 12/29/2017 1145   ALT 18 12/29/2017 1145   ALKPHOS 46 12/29/2017 1145   BILITOT 0.3 12/29/2017 1145   GFRNONAA 94 12/29/2017 1145   GFRAA 109 12/29/2017 1145     Assessment & Plan:   1. Uncontrolled type 2 diabetes mellitus with hyperglycemia (HCC)  - Martin Stevenson has currently uncontrolled symptomatic type 2 DM since 32 years of age. -Patient reports his fasting blood glucose readings range from 100 150, no recent A1c.  Recent labs show normal renal function.  -He does not report any gross complications from his diabetes, however patient with multiple sclerosis as well as seizure disorders,  and he remains at a high risk for more acute and chronic complications which include CAD, CVA, CKD, retinopathy, and neuropathy. These are all discussed in detail with him.  - I have counseled him on diet management and weight loss, by adopting a carbohydrate restricted/protein rich diet.  - Suggestion is made for him to avoid simple carbohydrates  from his diet including Cakes, Sweet Desserts, Ice Cream, Soda (diet and regular), Sweet Tea, Candies, Chips, Cookies, Store Bought Juices, Alcohol in Excess of  1-2 drinks a day,  Artificial Sweeteners,  Coffee Creamer, and "Sugar-free" Products. This will help patient to have more stable blood glucose profile and potentially avoid unintended weight gain.  - I encouraged him to switch to  unprocessed or minimally processed complex starch and increased protein intake (animal or plant source), fruits, and vegetables.  - he is advised to stick to a routine mealtimes to eat 3 meals  a day and avoid unnecessary snacks ( to snack only to correct hypoglycemia).   - he will be scheduled with Norm Salt, RDN, CDE for individualized diabetes education.  - I have approached him with the following individualized plan to manage diabetes and patient agrees:   -He will not require insulin treatment immediately.    -He is advised to continue Janumet 50/1000 mg p.o. twice daily, therapeutically suitable for the patient.  - Patient specific target  A1c;  LDL, HDL, Triglycerides, and  Waist Circumference were discussed in detail.  2) BP/HTN:  his blood pressure is controlled to target .  He is not on antihypertensive medications currently.  3) Lipids/HPL: Does not have recent lipid panel measures. -He would be considered for fasting lipid panel before his next visit.  4)  Weight/Diet:  Body mass index is 32.26 kg/m.  - clearly complicating his diabetes care.  I discussed with him the fact that loss of 5 - 10% of his  current body weight will have the most impact on his diabetes management.  CDE Consult will be initiated . Exercise, and detailed carbohydrates information provided  -  detailed on discharge instructions.  5) Chronic Care/Health Maintenance:  -he   is encouraged to initiate and continue to follow up with Ophthalmology, Dentist,  Podiatrist at least yearly or according to recommendations, and advised to  stay away from smoking. I have recommended yearly flu vaccine and pneumonia vaccine at least every 5 years; moderate intensity exercise for up to 150 minutes weekly;  and  sleep for at least 7 hours a day.  -  I advised patient to maintain close follow up with Avon Gully, MD for primary care needs.  - Time spent with the patient: 45 minutes, of which >50% was spent in obtaining information about his symptoms, reviewing his previous labs, evaluations, and treatments, counseling him about his currently uncontrolled type 2 diabetes, obesity, and developing developing  plans for long term treatment based on the latest recommendations.  Martin Stevenson participated in the discussions, expressed understanding, and voiced agreement with the above plans.  All questions were answered to his satisfaction. he is encouraged to contact clinic should he have any questions or concerns prior to his return visit.  Follow up plan: - Return in about 3 months (around 07/27/2018) for Follow up with Pre-visit Labs.  Marquis Lunch, MD [facility] [address] [place], [place] [address] Phone: [phone]  Fax: [phone]    04/27/2018, 1:38 PM  This note was partially dictated with voice recognition software. Similar sounding words can be transcribed inadequately or may not  be corrected upon review.

## 2018-04-27 NOTE — Patient Instructions (Signed)

## 2018-06-23 ENCOUNTER — Ambulatory Visit: Payer: Medicaid Other | Admitting: Adult Health

## 2018-07-13 DIAGNOSIS — Z0271 Encounter for disability determination: Secondary | ICD-10-CM

## 2018-07-26 DIAGNOSIS — Z0289 Encounter for other administrative examinations: Secondary | ICD-10-CM

## 2018-07-27 ENCOUNTER — Ambulatory Visit: Payer: Medicaid Other | Admitting: "Endocrinology

## 2018-08-31 ENCOUNTER — Ambulatory Visit: Payer: Medicaid Other | Admitting: "Endocrinology

## 2018-09-07 NOTE — Progress Notes (Deleted)
[facility]  PATIENT: Martin Stevenson DOB: 18-Dec-1985   REASON FOR VISIT: *** HISTORY FROM:    HISTORY OF PRESENT ILLNESS: 12/29/17 KWMr. Christell ConstantMoore is a 33 year old right-handed black male with a history of intractable seizures.  The patient has been on low-dose Depakote taking only 500 mg daily, he takes Keppra 750 mg twice daily.  He tolerates the medications well, but his seizures have not been under full control.  He is not able to operate a motor vehicle, he has not been able to maintain gainful employment because of the seizures.  The patient last had a seizure about 1 week prior to this visit.  Usually after the seizure he has soreness of the left shoulder and arm.  He is also noted some achy sensations in both legs that is better with walking, and is worse on the right leg, he claims that this started following the lumbar puncture.  The patient denies any problems controlling the bowels or the bladder, he has not had any balance issues or falls.  The patient returns to this office for an evaluation.  When last seen, MRI of the brain was done and showed lesions consistent with multiple sclerosis, lumbar puncture showed oligoclonal banding consistent with a diagnosis of MS.  MRI of the cervical spine did not show cord lesions, a visual evoked response test was ordered but has not yet been done.  The patient is not yet on any disease modifying agents for multiple sclerosis.  REVIEW OF SYSTEMS: Full 14 system review of systems performed and notable only for those listed, all others are neg:  Constitutional: neg  Cardiovascular: neg Ear/Nose/Throat: neg  Skin: neg Eyes: neg Respiratory: neg Gastroitestinal: neg  Hematology/Lymphatic: neg  Endocrine: neg Musculoskeletal:neg Allergy/Immunology: neg Neurological: neg Psychiatric: neg Sleep : neg   ALLERGIES: Allergies  Allergen Reactions  . Shellfish Allergy     HOME MEDICATIONS: Outpatient Medications Prior to  Visit  Medication Sig Dispense Refill  . divalproex (DEPAKOTE) 500 MG DR tablet Take 1 tablet (500 mg total) by mouth 2 (two) times daily. 180 tablet 1  . levETIRAcetam (KEPPRA) 750 MG tablet TAKE 1 TABLET BY MOUTH TWICE DAILY 180 tablet 1  . sitaGLIPtin-metformin (JANUMET) 50-1000 MG tablet Take 1 tablet by mouth 2 (two) times daily with a meal.    . TECFIDERA 240 MG CPDR Take 1 capsule by mouth 2 (two) times daily.  3   No facility-administered medications prior to visit.     PAST MEDICAL HISTORY: Past Medical History:  Diagnosis Date  . Diabetes (HCC)   . Fluid retention   . Hypertension   . Multiple sclerosis (HCC) 12/29/2017  . Seizures (HCC)     PAST SURGICAL HISTORY: Past Surgical History:  Procedure Laterality Date  . MOUTH SURGERY     x3, jaw, hip, chin    FAMILY HISTORY: Family History  Problem Relation Age of Onset  . Diabetes Mother   . Pulmonary embolism Mother        hx of blood clots  . Diabetes Father     SOCIAL HISTORY: Social History   Socioeconomic History  . Marital status: Single    Spouse name: Not on file  . Number of children: 3  . Years of education: HS  . Highest education level: Not on file  Occupational History    Comment: Unemployed  Social Needs  . Financial resource strain: Not on file  . Food insecurity:    Worry: Not on file  Inability: Not on file  . Transportation needs:    Medical: Not on file    Non-medical: Not on file  Tobacco Use  . Smoking status: Never Smoker  . Smokeless tobacco: Never Used  Substance and Sexual Activity  . Alcohol use: No    Comment: Quit: 2012  . Drug use: No  . Sexual activity: Not on file  Lifestyle  . Physical activity:    Days per week: Not on file    Minutes per session: Not on file  . Stress: Not on file  Relationships  . Social connections:    Talks on phone: Not on file    Gets together: Not on file    Attends religious service: Not on file    Active member of club or  organization: Not on file    Attends meetings of clubs or organizations: Not on file    Relationship status: Not on file  . Intimate partner violence:    Fear of current or ex partner: Not on file    Emotionally abused: Not on file    Physically abused: Not on file    Forced sexual activity: Not on file  Other Topics Concern  . Not on file  Social History Narrative   Pt lives at home with his spouse.   Caffeine Use: very little   Right handed      PHYSICAL EXAM  There were no vitals filed for this visit. There is no height or weight on file to calculate BMI.  Generalized: Well developed, in no acute distress  Head: normocephalic and atraumatic,. Oropharynx benign  Neck: Supple, no carotid bruits  Cardiac: Regular rate rhythm, no murmur  Musculoskeletal: No deformity   Neurological examination   Mentation: Alert oriented to time, place, history taking. Attention span and concentration appropriate. Recent and remote memory intact.  Follows all commands speech and language fluent.   Cranial nerve II-XII: Fundoscopic exam reveals sharp disc margins.Pupils were equal round reactive to light extraocular movements were full, visual field were full on confrontational test. Facial sensation and strength were normal. hearing was intact to finger rubbing bilaterally. Uvula tongue midline. head turning and shoulder shrug were normal and symmetric.Tongue protrusion into cheek strength was normal. Motor: normal bulk and tone, full strength in the BUE, BLE, fine finger movements normal, no pronator drift. No focal weakness Sensory: normal and symmetric to light touch, pinprick, and  Vibration, proprioception  Coordination: finger-nose-finger, heel-to-shin bilaterally, no dysmetria Reflexes: Brachioradialis 2/2, biceps 2/2, triceps 2/2, patellar 2/2, Achilles 2/2, plantar responses were flexor bilaterally. Gait and Station: Rising up from seated position without assistance, normal stance,   moderate stride, good arm swing, smooth turning, able to perform tiptoe, and heel walking without difficulty. Tandem gait is steady  DIAGNOSTIC DATA (LABS, IMAGING, TESTING) - I reviewed patient records, labs, notes, testing and imaging myself where available.  Lab Results  Component Value Date   WBC 5.1 12/29/2017   HGB 13.6 12/29/2017   HCT 43.1 12/29/2017   MCV 83 12/29/2017   PLT 271 12/29/2017      Component Value Date/Time   NA 145 (H) 12/29/2017 1145   K 4.4 12/29/2017 1145   CL 106 12/29/2017 1145   CO2 23 12/29/2017 1145   GLUCOSE 109 (H) 12/29/2017 1145   GLUCOSE 126 (H) 07/04/2012 1555   BUN 12 12/29/2017 1145   CREATININE 1.04 12/29/2017 1145   CALCIUM 9.9 12/29/2017 1145   PROT 7.3 12/29/2017 1145   ALBUMIN 4.5  12/29/2017 1145   AST 16 12/29/2017 1145   ALT 18 12/29/2017 1145   ALKPHOS 46 12/29/2017 1145   BILITOT 0.3 12/29/2017 1145   GFRNONAA 94 12/29/2017 1145   GFRAA 109 12/29/2017 1145    ASSESSMENT AND PLAN  33 y.o. year old male  has a past medical history of Diabetes (HCC), Fluid retention, Hypertension, Multiple sclerosis (HCC) (12/29/2017), and Seizures (HCC). here with ***  Intractable seizures  2.  Multiple sclerosis  The patient will have blood work done today.  The Depakote dosing will be increased to 500 mg twice daily, Keppra will be maintained at 750 mg twice daily.  He will be placed on Tecfidera in the next week or 2.  The patient will follow-up in 4 months.   Nilda Riggs, [facility], [facility], APRN  [facility] [address] [place], [place] [address] [phone]

## 2018-09-08 ENCOUNTER — Ambulatory Visit: Payer: Medicaid Other | Admitting: Nurse Practitioner

## 2018-09-08 ENCOUNTER — Telehealth: Payer: Self-pay | Admitting: *Deleted

## 2018-09-08 NOTE — Telephone Encounter (Signed)
Patient called this morning and canceled his FU this afternoon, re: no transportation.

## 2018-09-09 ENCOUNTER — Encounter: Payer: Self-pay | Admitting: Nurse Practitioner

## 2018-09-09 ENCOUNTER — Ambulatory Visit: Payer: Medicaid Other | Admitting: Nurse Practitioner

## 2018-09-09 ENCOUNTER — Encounter: Payer: Self-pay | Admitting: Neurology

## 2018-09-09 VITALS — BP 152/100 | HR 93 | Ht 67.0 in | Wt 209.0 lb

## 2018-09-09 DIAGNOSIS — G35 Multiple sclerosis: Secondary | ICD-10-CM | POA: Diagnosis not present

## 2018-09-09 DIAGNOSIS — R569 Unspecified convulsions: Secondary | ICD-10-CM

## 2018-09-09 DIAGNOSIS — Z5181 Encounter for therapeutic drug level monitoring: Secondary | ICD-10-CM | POA: Diagnosis not present

## 2018-09-09 MED ORDER — DIVALPROEX SODIUM 500 MG PO DR TAB: DELAYED_RELEASE_TABLET | ORAL | 6 refills | Status: DC

## 2018-09-09 MED ORDER — LEVETIRACETAM 750 MG PO TABS: 750.0000 mg | ORAL_TABLET | Freq: Two times a day (BID) | ORAL | 1 refills | Status: DC

## 2018-09-09 NOTE — Progress Notes (Signed)
[facility]  PATIENT: Martin Stevenson DOB: 01-Feb-1986   REASON FOR VISIT: Follow-up for seizure disorder newly diagnosed multiple sclerosis HISTORY FROM: Patient    HISTORY OF PRESENT ILLNESS:UPDATE 1/30/2020CM Mr Fulgham, 33 year old male returns for follow-up with intractable seizure disorder and newly diagnosed multiple sclerosis.  Patient reports that he has had several seizures since last seen the most recent was 3 days ago.  He states he has noticed a increase in his seizures for about 6 months now.  They are mild he has not gone to the emergency room.  He did not call our office to let us know.  He is currently on Depakote 500mg  twice daily  and Keppra 750 twice daily.  Last VPA level was 38.  Keppra level was 8.1 on Dec 29, 2017.  JC virus was elevated at 3.58.  He claims he has not been able to obtain  employment because of the seizures. MRI of the brain3/6/2019showed lesions consistent with multiple sclerosis, lumbar puncture showed oligoclonal banding consistent with a diagnosis of MS.  MRI of the cervical spine did not show cord lesions.  He is currently on tecfidera and has been on tecfidera for approximately 6 months.  He has occasional hot flashes and was encouraged to take the medication on a full meal with protein.  He complains with some weakness in his arms.  He denies any falls.  He denies any visual disturbance.  He denies any loss of bowel or bladder control.  He denies any sensory changes speech or swallowing problems, he returns for reevaluation   12/29/17 KWMr. Quintanar is a 33 year old right-handed black male with a history of intractable seizures.  The patient has been on low-dose Depakote taking only 500 mg daily, he takes Keppra 750 mg twice daily.  He tolerates the medications well, but his seizures have not been under full control.  He is not able to operate a motor vehicle, he has not been able to maintain gainful employment because of the seizures.  The  patient last had a seizure about 1 week prior to this visit.  Usually after the seizure he has soreness of the left shoulder and arm.  He is also noted some achy sensations in both legs that is better with walking, and is worse on the right leg, he claims that this started following the lumbar puncture.  The patient denies any problems controlling the bowels or the bladder, he has not had any balance issues or falls.  The patient returns to this office for an evaluation.  When last seen, MRI of the brain was done and showed lesions consistent with multiple sclerosis, lumbar puncture showed oligoclonal banding consistent with a diagnosis of MS.  MRI of the cervical spine did not show cord lesions, a visual evoked response test was ordered but has not yet been done.  The patient is not yet on any disease modifying agents for multiple sclerosis.  REVIEW OF SYSTEMS: Full 14 system review of systems performed and notable only for those listed, all others are neg:  Constitutional: neg  Cardiovascular: neg Ear/Nose/Throat: neg  Skin: neg Eyes: neg Respiratory: neg Gastroitestinal: neg  Hematology/Lymphatic: neg  Endocrine: neg Musculoskeletal: Joint pain back pain neck pain Allergy/Immunology: neg Neurological: Intractable seizure disorder Psychiatric: Depression anxiety Sleep : neg   ALLERGIES: Allergies  Allergen Reactions  . Shellfish Allergy     HOME MEDICATIONS: Outpatient Medications Prior to Visit  Medication Sig Dispense Refill  . divalproex (DEPAKOTE) 500 MG DR  tablet Take 1 tablet (500 mg total) by mouth 2 (two) times daily. (Patient taking differently: Take 500 mg by mouth 2 (two) times daily. 1 in the am and 2 in the pm 12 hours apart) 180 tablet 1  . levETIRAcetam (KEPPRA) 750 MG tablet TAKE 1 TABLET BY MOUTH TWICE DAILY 180 tablet 1  . sitaGLIPtin-metformin (JANUMET) 50-1000 MG tablet Take 1 tablet by mouth 2 (two) times daily with a meal.    . TECFIDERA 240 MG CPDR Take 1  capsule by mouth 2 (two) times daily.  3   No facility-administered medications prior to visit.     PAST MEDICAL HISTORY: Past Medical History:  Diagnosis Date  . Diabetes (HCC)   . Fluid retention   . Hypertension   . Multiple sclerosis (HCC) 12/29/2017  . Seizures (HCC)     PAST SURGICAL HISTORY: Past Surgical History:  Procedure Laterality Date  . MOUTH SURGERY     x3, jaw, hip, chin    FAMILY HISTORY: Family History  Problem Relation Age of Onset  . Diabetes Mother   . Pulmonary embolism Mother        hx of blood clots  . Diabetes Father     SOCIAL HISTORY: Social History   Socioeconomic History  . Marital status: Single    Spouse name: Not on file  . Number of children: 3  . Years of education: HS  . Highest education level: Not on file  Occupational History    Comment: Unemployed  Social Needs  . Financial resource strain: Not on file  . Food insecurity:    Worry: Not on file    Inability: Not on file  . Transportation needs:    Medical: Not on file    Non-medical: Not on file  Tobacco Use  . Smoking status: Never Smoker  . Smokeless tobacco: Never Used  Substance and Sexual Activity  . Alcohol use: No    Comment: Quit: 2012  . Drug use: No  . Sexual activity: Not on file  Lifestyle  . Physical activity:    Days per week: Not on file    Minutes per session: Not on file  . Stress: Not on file  Relationships  . Social connections:    Talks on phone: Not on file    Gets together: Not on file    Attends religious service: Not on file    Active member of club or organization: Not on file    Attends meetings of clubs or organizations: Not on file    Relationship status: Not on file  . Intimate partner violence:    Fear of current or ex partner: Not on file    Emotionally abused: Not on file    Physically abused: Not on file    Forced sexual activity: Not on file  Other Topics Concern  . Not on file  Social History Narrative   Pt lives at  home with his spouse.   Caffeine Use: very little   Right handed      PHYSICAL EXAM  Vitals:   09/09/18 0741  BP: (!) 152/100  Pulse: 93  Weight: 209 lb (94.8 kg)  Height: 5\' 7"  (1.702 m)   Body mass index is 32.73 kg/m.  Generalized: Well developed, in no acute distress  Head: normocephalic and atraumatic,. Oropharynx benign  Neck: Supple,  Musculoskeletal: No deformity   Neurological examination   Mentation: Alert oriented to time, place, history taking. Attention span and concentration appropriate. Recent and  remote memory intact.  Follows all commands speech and language fluent.   Cranial nerve II-XII: Fundoscopic exam reveals sharp disc margins.Pupils were equal round reactive to light extraocular movements were full, visual field were full on confrontational test. Facial sensation and strength were normal. hearing was intact to finger rubbing bilaterally. Uvula tongue midline. head turning and shoulder shrug were normal and symmetric.Tongue protrusion into cheek strength was normal. Motor: normal bulk and tone, full strength in the BUE, BLE, Sensory: normal and symmetric to light touch, pinprick, and  Vibration, in the upper and lower extremities Coordination: finger-nose-finger, heel-to-shin bilaterally, no dysmetria Reflexes: Brachioradialis 2/2, biceps 2/2, triceps 2/2, patellar 2/2, Achilles 2/2, plantar responses were flexor bilaterally. Gait and Station: Rising up from seated position without assistance, normal stance,  moderate stride, good arm swing, smooth turning, able to perform tiptoe, and heel walking without difficulty. Tandem gait is steady  DIAGNOSTIC DATA (LABS, IMAGING, TESTING) - I reviewed patient records, labs, notes, testing and imaging myself where available.  Lab Results  Component Value Date   WBC 5.1 12/29/2017   HGB 13.6 12/29/2017   HCT 43.1 12/29/2017   MCV 83 12/29/2017   PLT 271 12/29/2017      Component Value Date/Time   NA 145 (H)  12/29/2017 1145   K 4.4 12/29/2017 1145   CL 106 12/29/2017 1145   CO2 23 12/29/2017 1145   GLUCOSE 109 (H) 12/29/2017 1145   GLUCOSE 126 (H) 07/04/2012 1555   BUN 12 12/29/2017 1145   CREATININE 1.04 12/29/2017 1145   CALCIUM 9.9 12/29/2017 1145   PROT 7.3 12/29/2017 1145   ALBUMIN 4.5 12/29/2017 1145   AST 16 12/29/2017 1145   ALT 18 12/29/2017 1145   ALKPHOS 46 12/29/2017 1145   BILITOT 0.3 12/29/2017 1145   GFRNONAA 94 12/29/2017 1145   GFRAA 109 12/29/2017 1145    ASSESSMENT AND PLAN  33 y.o. year old male  has a past medical history of intractable seizure disorder and newly diagnosed multiple sclerosis.  The patient stated increase in his seizure frequency over the last 6 months they continue to be brief.  His last seizure was 3 days ago.  He has started tecfidera for his multiple sclerosis management    PLAN: Increase Depakote 500mg  to 3 tabs daily Continue Keppra 750mg  twice daily Continue Tecfidera 240mg  twice daily Will check labs today CBC and CMP  Will check Depakote level  F/U in 6 months I spent 25 minutes in total face to face time with the patient more than 50% of which was spent counseling and coordination of care, reviewing test results reviewing medications and discussing and reviewing the diagnosis of seizure disorder and importance of notifying us when seizures occur so that medications can be adjusted.  Also discussed MS management.  Make sure you have protein in your diet with your meals.  This will result in less flushing with tecfidera. Nilda Riggs, [facility], [facility], APRN  [facility] [address] [place], [place] [address] [phone]

## 2018-09-09 NOTE — Progress Notes (Signed)
I have read the note, and I agree with the clinical assessment and plan.  Charles K Willis   

## 2018-09-09 NOTE — Patient Instructions (Signed)
Increase Depakote 500mg  to 3 tabs daily Continue Keppra 750mg  twice daily Continue Tecfidera 240mg  twice daily Will check labs today CBC and CMP  Will check Depakote level  F/U in 6 months

## 2018-09-10 ENCOUNTER — Telehealth: Payer: Self-pay | Admitting: *Deleted

## 2018-09-10 LAB — CBC WITH DIFFERENTIAL/PLATELET
BASOS ABS: 0 10*3/uL (ref 0.0–0.2)
BASOS: 0 %
EOS (ABSOLUTE): 0.1 10*3/uL (ref 0.0–0.4)
EOS: 2 %
Hematocrit: 43.4 % (ref 37.5–51.0)
Hemoglobin: 14.3 g/dL (ref 13.0–17.7)
IMMATURE GRANULOCYTES: 1 %
Immature Grans (Abs): 0.1 10*3/uL (ref 0.0–0.1)
LYMPHS: 47 %
Lymphocytes Absolute: 2.5 10*3/uL (ref 0.7–3.1)
MCH: 26.9 pg (ref 26.6–33.0)
MCHC: 32.9 g/dL (ref 31.5–35.7)
MCV: 82 fL (ref 79–97)
MONOCYTES: 9 %
Monocytes Absolute: 0.5 10*3/uL (ref 0.1–0.9)
Neutrophils Absolute: 2.2 10*3/uL (ref 1.4–7.0)
Neutrophils: 41 %
PLATELETS: 229 10*3/uL (ref 150–450)
RBC: 5.32 x10E6/uL (ref 4.14–5.80)
RDW: 14 % (ref 11.6–15.4)
WBC: 5.3 10*3/uL (ref 3.4–10.8)

## 2018-09-10 LAB — COMPREHENSIVE METABOLIC PANEL
ALK PHOS: 45 IU/L (ref 39–117)
ALT: 36 IU/L (ref 0–44)
AST: 22 IU/L (ref 0–40)
Albumin/Globulin Ratio: 1.7 (ref 1.2–2.2)
Albumin: 4.9 g/dL (ref 4.0–5.0)
BILIRUBIN TOTAL: 0.5 mg/dL (ref 0.0–1.2)
BUN/Creatinine Ratio: 14 (ref 9–20)
BUN: 13 mg/dL (ref 6–20)
CHLORIDE: 102 mmol/L (ref 96–106)
CO2: 22 mmol/L (ref 20–29)
CREATININE: 0.94 mg/dL (ref 0.76–1.27)
Calcium: 9.8 mg/dL (ref 8.7–10.2)
GFR calc Af Amer: 122 mL/min/{1.73_m2} (ref >59)
GFR calc non Af Amer: 105 mL/min/{1.73_m2} (ref >59)
GLUCOSE: 111 mg/dL — AB (ref 65–99)
Globulin, Total: 2.9 g/dL (ref 1.5–4.5)
Potassium: 4.5 mmol/L (ref 3.5–5.2)
Sodium: 143 mmol/L (ref 134–144)
TOTAL PROTEIN: 7.8 g/dL (ref 6.0–8.5)

## 2018-09-10 LAB — VALPROIC ACID LEVEL: Valproic Acid Lvl: 30 ug/mL — ABNORMAL LOW (ref 50–100)

## 2018-09-10 NOTE — Telephone Encounter (Signed)
Spoke with Martin Stevenson and reviewed below lab results.  He verbalized understanding of same, will call for any sz. activity/fim

## 2018-09-10 NOTE — Telephone Encounter (Signed)
-----   Message from Nilda Riggs, NP sent at 09/10/2018  8:38 AM EST ----- Labs ok except VPA level 30. I increased the dose on your visit. Call for additional seizures. Please

## 2018-09-21 ENCOUNTER — Other Ambulatory Visit: Payer: Self-pay | Admitting: "Endocrinology

## 2018-09-22 ENCOUNTER — Ambulatory Visit (INDEPENDENT_AMBULATORY_CARE_PROVIDER_SITE_OTHER): Payer: Medicaid Other | Admitting: "Endocrinology

## 2018-09-22 ENCOUNTER — Encounter: Payer: Self-pay | Admitting: "Endocrinology

## 2018-09-22 VITALS — BP 131/79 | HR 85 | Ht 67.0 in | Wt 209.0 lb

## 2018-09-22 DIAGNOSIS — E1165 Type 2 diabetes mellitus with hyperglycemia: Secondary | ICD-10-CM | POA: Diagnosis not present

## 2018-09-22 DIAGNOSIS — E559 Vitamin D deficiency, unspecified: Secondary | ICD-10-CM

## 2018-09-22 LAB — COMPREHENSIVE METABOLIC PANEL
ALT: 15 IU/L (ref 0–44)
AST: 12 IU/L (ref 0–40)
Albumin/Globulin Ratio: 1.6 (ref 1.2–2.2)
Albumin: 4.7 g/dL (ref 4.0–5.0)
Alkaline Phosphatase: 40 IU/L (ref 39–117)
BUN/Creatinine Ratio: 13 (ref 9–20)
BUN: 12 mg/dL (ref 6–20)
Bilirubin Total: 0.3 mg/dL (ref 0.0–1.2)
CO2: 23 mmol/L (ref 20–29)
Calcium: 10.1 mg/dL (ref 8.7–10.2)
Chloride: 99 mmol/L (ref 96–106)
Creatinine, Ser: 0.92 mg/dL (ref 0.76–1.27)
GFR calc non Af Amer: 108 mL/min/{1.73_m2} (ref >59)
GFR, EST AFRICAN AMERICAN: 125 mL/min/{1.73_m2} (ref >59)
Globulin, Total: 3 g/dL (ref 1.5–4.5)
Glucose: 165 mg/dL — ABNORMAL HIGH (ref 65–99)
Potassium: 3.8 mmol/L (ref 3.5–5.2)
Sodium: 140 mmol/L (ref 134–144)
Total Protein: 7.7 g/dL (ref 6.0–8.5)

## 2018-09-22 LAB — SPECIMEN STATUS REPORT

## 2018-09-22 LAB — LIPID PANEL W/O CHOL/HDL RATIO
Cholesterol, Total: 198 mg/dL (ref 100–199)
HDL: 76 mg/dL (ref >39)
LDL Calculated: 100 mg/dL — ABNORMAL HIGH (ref 0–99)
Triglycerides: 111 mg/dL (ref 0–149)
VLDL Cholesterol Cal: 22 mg/dL (ref 5–40)

## 2018-09-22 LAB — MICROALBUMIN / CREATININE URINE RATIO
Creatinine, Urine: 281.1 mg/dL
MICROALBUM., U, RANDOM: 10.2 ug/mL
Microalb/Creat Ratio: 4 mg/g creat (ref 0–29)

## 2018-09-22 LAB — HGB A1C W/O EAG: HEMOGLOBIN A1C: 6.2 % — AB (ref 4.8–5.6)

## 2018-09-22 LAB — VITAMIN D 25 HYDROXY (VIT D DEFICIENCY, FRACTURES): Vit D, 25-Hydroxy: 11.9 ng/mL — ABNORMAL LOW (ref 30.0–100.0)

## 2018-09-22 LAB — T4, FREE: Free T4: 1.19 ng/dL (ref 0.82–1.77)

## 2018-09-22 LAB — TSH: TSH: 1.82 u[IU]/mL (ref 0.450–4.500)

## 2018-09-22 MED ORDER — VITAMIN D (ERGOCALCIFEROL) 1.25 MG (50000 UNIT) PO CAPS: 50000.0000 [IU] | ORAL_CAPSULE | ORAL | 0 refills | Status: DC

## 2018-09-22 NOTE — Patient Instructions (Signed)

## 2018-09-22 NOTE — Progress Notes (Signed)
Endocrinology f/u Note       09/22/2018, 1:01 PM   Subjective:    Patient ID: Martin Stevenson, male    DOB: 1986-03-31.  Martin Stevenson is being seen in f/u for management of currently uncontrolled symptomatic diabetes requested by  Avon GullyFanta, Tesfaye, MD.   Past Medical History:  Diagnosis Date  . Diabetes (HCC)   . Fluid retention   . Hypertension   . Multiple sclerosis (HCC) 12/29/2017  . Seizures (HCC)    Past Surgical History:  Procedure Laterality Date  . MOUTH SURGERY     x3, jaw, hip, chin   Social History   Socioeconomic History  . Marital status: Single    Spouse name: Not on file  . Number of children: 3  . Years of education: HS  . Highest education level: Not on file  Occupational History    Comment: Unemployed  Social Needs  . Financial resource strain: Not on file  . Food insecurity:    Worry: Not on file    Inability: Not on file  . Transportation needs:    Medical: Not on file    Non-medical: Not on file  Tobacco Use  . Smoking status: Never Smoker  . Smokeless tobacco: Never Used  Substance and Sexual Activity  . Alcohol use: No    Comment: Quit: 2012  . Drug use: No  . Sexual activity: Not on file  Lifestyle  . Physical activity:    Days per week: Not on file    Minutes per session: Not on file  . Stress: Not on file  Relationships  . Social connections:    Talks on phone: Not on file    Gets together: Not on file    Attends religious service: Not on file    Active member of club or organization: Not on file    Attends meetings of clubs or organizations: Not on file    Relationship status: Not on file  Other Topics Concern  . Not on file  Social History Narrative   Pt lives at home with his spouse.   Caffeine Use: very little   Right handed    Outpatient Encounter Medications as of 09/22/2018  Medication Sig  . divalproex (DEPAKOTE) 500 MG DR tablet 1 in  the AM and 2 in the PM  . levETIRAcetam (KEPPRA) 750 MG tablet Take 1 tablet (750 mg total) by mouth 2 (two) times daily.  . sitaGLIPtin-metformin (JANUMET) 50-1000 MG tablet Take 1 tablet by mouth 2 (two) times daily with a meal.  . TECFIDERA 240 MG CPDR Take 1 capsule by mouth 2 (two) times daily.  . Vitamin D, Ergocalciferol, (DRISDOL) 1.25 MG (50000 UT) CAPS capsule Take 1 capsule (50,000 Units total) by mouth every 7 (seven) days.  . [DISCONTINUED] JANUVIA 50 MG tablet Take 50 mg by mouth daily.  . [DISCONTINUED] metFORMIN (GLUCOPHAGE) 1000 MG tablet Take 1,000 mg by mouth 2 (two) times daily.   No facility-administered encounter medications on file as of 09/22/2018.     ALLERGIES: Allergies  Allergen Reactions  . Shellfish Allergy     VACCINATION STATUS:  There is  no immunization history on file for this patient.  Diabetes  He presents for his follow-up diabetic visit. He has type 2 diabetes mellitus. Onset time: He was diagnosed at approximate age of 33 years. His disease course has been improving. There are no hypoglycemic associated symptoms. Pertinent negatives for hypoglycemia include no confusion, headaches, pallor or seizures. There are no diabetic associated symptoms. Pertinent negatives for diabetes include no chest pain, no fatigue, no polydipsia, no polyphagia, no polyuria and no weakness. There are no hypoglycemic complications. Symptoms are improving. There are no diabetic complications. Risk factors for coronary artery disease include diabetes mellitus, male sex, obesity and sedentary lifestyle. Current diabetic treatments: He is on Janumet 50/1000 mg p.o. twice daily. Since his last visit, he received separate rx for MTF and Januvia by other providers. His weight is stable. He is following a generally unhealthy diet. When asked about meal planning, he reported none. He has not had a previous visit with a dietitian. He rarely participates in exercise. (He did not bring any  meter nor logs to review today. No recent A1c to review.) An ACE inhibitor/angiotensin II receptor blocker is not being taken. He does not see a podiatrist.Eye exam is not current.      Review of Systems  Constitutional: Negative for chills, fatigue, fever and unexpected weight change.  HENT: Negative for dental problem, mouth sores and trouble swallowing.   Eyes: Negative for visual disturbance.  Respiratory: Negative for cough, choking, chest tightness, shortness of breath and wheezing.   Cardiovascular: Negative for chest pain, palpitations and leg swelling.  Gastrointestinal: Negative for abdominal distention, abdominal pain, constipation, diarrhea, nausea and vomiting.  Endocrine: Negative for polydipsia, polyphagia and polyuria.  Genitourinary: Negative for dysuria, flank pain, hematuria and urgency.  Musculoskeletal: Negative for back pain, gait problem, myalgias and neck pain.  Skin: Negative for pallor, rash and wound.  Neurological: Negative for seizures, syncope, weakness, numbness and headaches.  Psychiatric/Behavioral: Negative for confusion and dysphoric mood.    Objective:    BP 131/79   Pulse 85   Ht 5\' 7"  (1.702 m)   Wt 209 lb (94.8 kg)   BMI 32.73 kg/m   Wt Readings from Last 3 Encounters:  09/22/18 209 lb (94.8 kg)  09/09/18 209 lb (94.8 kg)  04/27/18 206 lb (93.4 kg)     Physical Exam Constitutional:      General: He is not in acute distress.    Appearance: He is well-developed.  HENT:     Head: Normocephalic and atraumatic.  Neck:     Musculoskeletal: Normal range of motion and neck supple.     Thyroid: No thyromegaly.     Trachea: No tracheal deviation.  Cardiovascular:     Rate and Rhythm: Normal rate.     Pulses:          Dorsalis pedis pulses are 1+ on the right side and 1+ on the left side.       Posterior tibial pulses are 1+ on the right side and 1+ on the left side.     Heart sounds: Normal heart sounds, S1 normal and S2 normal. No  murmur. No gallop.   Pulmonary:     Effort: No respiratory distress.     Breath sounds: Normal breath sounds. No wheezing.  Abdominal:     General: Bowel sounds are normal. There is no distension.     Palpations: Abdomen is soft.     Tenderness: There is no abdominal tenderness. There is no  guarding.  Musculoskeletal:     Right shoulder: He exhibits no swelling and no deformity.  Skin:    General: Skin is warm and dry.     Findings: No rash.     Nails: There is no clubbing.   Neurological:     Mental Status: He is alert and oriented to person, place, and time.     Cranial Nerves: No cranial nerve deficit.     Sensory: No sensory deficit.     Gait: Gait normal.     Deep Tendon Reflexes: Reflexes are normal and symmetric.  Psychiatric:        Speech: Speech normal.        Behavior: Behavior normal. Behavior is cooperative.        Thought Content: Thought content normal.        Judgment: Judgment normal.      CMP ( most recent) CMP   Recent Results (from the past 2160 hour(s))  Comprehensive metabolic panel     Status: Abnormal   Collection Time: 09/09/18  8:35 AM  Result Value Ref Range   Glucose 111 (H) 65 - 99 mg/dL   BUN 13 6 - 20 mg/dL   Creatinine, Ser 4.09 0.76 - 1.27 mg/dL   GFR calc non Af Amer 105 >59 mL/min/1.73   GFR calc Af Amer 122 >59 mL/min/1.73   BUN/Creatinine Ratio 14 9 - 20   Sodium 143 134 - 144 mmol/L   Potassium 4.5 3.5 - 5.2 mmol/L   Chloride 102 96 - 106 mmol/L   CO2 22 20 - 29 mmol/L   Calcium 9.8 8.7 - 10.2 mg/dL   Total Protein 7.8 6.0 - 8.5 g/dL   Albumin 4.9 4.0 - 5.0 g/dL    Comment:               **Please note reference interval change**   Globulin, Total 2.9 1.5 - 4.5 g/dL   Albumin/Globulin Ratio 1.7 1.2 - 2.2   Bilirubin Total 0.5 0.0 - 1.2 mg/dL   Alkaline Phosphatase 45 39 - 117 IU/L   AST 22 0 - 40 IU/L   ALT 36 0 - 44 IU/L  CBC with Differential/Platelet     Status: None   Collection Time: 09/09/18  8:35 AM  Result Value  Ref Range   WBC 5.3 3.4 - 10.8 x10E3/uL   RBC 5.32 4.14 - 5.80 x10E6/uL   Hemoglobin 14.3 13.0 - 17.7 g/dL   Hematocrit 81.1 91.4 - 51.0 %   MCV 82 79 - 97 fL   MCH 26.9 26.6 - 33.0 pg   MCHC 32.9 31.5 - 35.7 g/dL   RDW 78.2 95.6 - 21.3 %   Platelets 229 150 - 450 x10E3/uL   Neutrophils 41 Not Estab. %   Lymphs 47 Not Estab. %   Monocytes 9 Not Estab. %   Eos 2 Not Estab. %   Basos 0 Not Estab. %   Neutrophils Absolute 2.2 1.4 - 7.0 x10E3/uL   Lymphocytes Absolute 2.5 0.7 - 3.1 x10E3/uL   Monocytes Absolute 0.5 0.1 - 0.9 x10E3/uL   EOS (ABSOLUTE) 0.1 0.0 - 0.4 x10E3/uL   Basophils Absolute 0.0 0.0 - 0.2 x10E3/uL   Immature Granulocytes 1 Not Estab. %   Immature Grans (Abs) 0.1 0.0 - 0.1 x10E3/uL  Valproic acid level     Status: Abnormal   Collection Time: 09/09/18  8:35 AM  Result Value Ref Range   Valproic Acid Lvl 30 (L) 50 - 100 ug/mL  Comment:                                 Detection Limit = 4                            <4 indicates None Detected Toxicity may occur at levels of 100-500. Measurements of free unbound valproic acid may improve the assess- ment of clinical response.   Comprehensive metabolic panel     Status: Abnormal   Collection Time: 09/21/18  2:53 PM  Result Value Ref Range   Glucose 165 (H) 65 - 99 mg/dL   BUN 12 6 - 20 mg/dL   Creatinine, Ser 6.96 0.76 - 1.27 mg/dL   GFR calc non Af Amer 108 >59 mL/min/1.73   GFR calc Af Amer 125 >59 mL/min/1.73   BUN/Creatinine Ratio 13 9 - 20   Sodium 140 134 - 144 mmol/L   Potassium 3.8 3.5 - 5.2 mmol/L   Chloride 99 96 - 106 mmol/L   CO2 23 20 - 29 mmol/L   Calcium 10.1 8.7 - 10.2 mg/dL   Total Protein 7.7 6.0 - 8.5 g/dL   Albumin 4.7 4.0 - 5.0 g/dL    Comment:               **Please note reference interval change**   Globulin, Total 3.0 1.5 - 4.5 g/dL   Albumin/Globulin Ratio 1.6 1.2 - 2.2   Bilirubin Total 0.3 0.0 - 1.2 mg/dL   Alkaline Phosphatase 40 39 - 117 IU/L   AST 12 0 - 40 IU/L   ALT 15 0 -  44 IU/L  Lipid Panel w/o Chol/HDL Ratio     Status: Abnormal   Collection Time: 09/21/18  2:53 PM  Result Value Ref Range   Cholesterol, Total 198 100 - 199 mg/dL   Triglycerides 789 0 - 149 mg/dL   HDL 76 >38 mg/dL   VLDL Cholesterol Cal 22 5 - 40 mg/dL   LDL Calculated 101 (H) 0 - 99 mg/dL  Microalbumin / creatinine urine ratio     Status: None (Preliminary result)   Collection Time: 09/21/18  2:53 PM  Result Value Ref Range   Creatinine, Urine WILL FOLLOW    Microalbumin, Urine WILL FOLLOW    Microalb/Creat Ratio WILL FOLLOW   Hgb A1c w/o eAG     Status: Abnormal   Collection Time: 09/21/18  2:53 PM  Result Value Ref Range   Hgb A1c MFr Bld 6.2 (H) 4.8 - 5.6 %    Comment:          Prediabetes: 5.7 - 6.4          Diabetes: >6.4          Glycemic control for adults with diabetes: <7.0   T4, free     Status: None   Collection Time: 09/21/18  2:53 PM  Result Value Ref Range   Free T4 1.19 0.82 - 1.77 ng/dL  TSH     Status: None   Collection Time: 09/21/18  2:53 PM  Result Value Ref Range   TSH 1.820 0.450 - 4.500 uIU/mL  VITAMIN D 25 Hydroxy (Vit-D Deficiency, Fractures)     Status: Abnormal   Collection Time: 09/21/18  2:53 PM  Result Value Ref Range   Vit D, 25-Hydroxy 11.9 (L) 30.0 - 100.0 ng/mL    Comment: Vitamin D deficiency  has been defined by the Institute of Medicine and an Endocrine Society practice guideline as a level of serum 25-OH vitamin D less than 20 ng/mL (1,2). The Endocrine Society went on to further define vitamin D insufficiency as a level between 21 and 29 ng/mL (2). 1. IOM (Institute of Medicine). 2010. Dietary reference    intakes for calcium and D. [place]: The    [employer]. 2. Holick MF, Binkley Marianna, Bischoff-Ferrari HA, et al.    Evaluation, treatment, and prevention of vitamin D    deficiency: an Endocrine Society clinical practice    guideline. JCEM. 2011 Jul; 96(7):1911-30.   Specimen status report     Status: None    Collection Time: 09/21/18  2:53 PM  Result Value Ref Range   specimen status report Comment     Comment: Gloris Manchester CMP14 Default Gloris Manchester CMP14 Default A hand-written panel/profile was received from your office. In accordance with the [facility] Ambiguous Test Code Policy dated July 2003, we have completed your order by using the closest currently or formerly recognized AMA panel.  We have assigned Comprehensive Metabolic Panel (14), Test Code #322000 to this request.  If this is not the testing you wished to receive on this specimen, please contact the [facility] Client Inquiry/Technical Services Department to clarify the test order.  We appreciate your business. Ambig Abbrev LP Default Ambig Abbrev LP Default A hand-written panel/profile was received from your office. In accordance with the [facility] Ambiguous Test Code Policy dated July 2003, we have completed your order by using the closest currently or formerly recognized AMA panel.  We have assigned Lipid Panel, Test Code [id number] to this request. If this is not the testing you wished to receive on this specimen, plea se contact the [facility] Client Inquiry/Technical Services Department to clarify the test order.  We appreciate your business.      Assessment & Plan:   1. Uncontrolled type 2 diabetes mellitus with hyperglycemia (HCC)  - Martin Stevenson has currently uncontrolled symptomatic type 2 DM since 33 years of age. - He returns with improved a1c of 6.2%. - Recent labs show normal renal function, vit D def.  -He does not report any gross complications from his diabetes, however patient with multiple sclerosis as well as seizure disorders,  and he remains at a high risk for more acute and chronic complications which include CAD, CVA, CKD, retinopathy, and neuropathy. These are all discussed in detail with him.  - I have counseled him on diet management and weight loss, by adopting a carbohydrate restricted/protein rich  diet.  - Patient admits there is a room for improvement in his diet and drink choices. -  Suggestion is made for him to avoid simple carbohydrates  from his diet including Cakes, Sweet Desserts / Pastries, Ice Cream, Soda (diet and regular), Sweet Tea, Candies, Chips, Cookies, Store Bought Juices, Alcohol in Excess of  1-2 drinks a day, Artificial Sweeteners, and "Sugar-free" Products. This will help patient to have stable blood glucose profile and potentially avoid unintended weight gain.  - I encouraged him to switch to  unprocessed or minimally processed complex starch and increased protein intake (animal or plant source), fruits, and vegetables.  - he is advised to stick to a routine mealtimes to eat 3 meals  a day and avoid unnecessary snacks ( to snack only to correct hypoglycemia).    - I have approached him with the following individualized plan to manage diabetes and patient agrees:   -  He will not require insulin treatment immediately.    -He is advised to  D/c metformin and Januvia, but continue Janumet 50/1000 mg p.o. twice daily, therapeutically suitable for the patient.  - Patient specific target  A1c;  LDL, HDL, Triglycerides, and  Waist Circumference were discussed in detail.  2) BP/HTN:  his blood pressure is controlled to target .  He is not on antihypertensive medications currently.  3) Lipids/HPL: LDL is 100, not on statin. Will be observed for nanother fasting lipid panel.  4)  Weight/Diet:  Body mass index is 32.73 kg/m.  - clearly complicating his diabetes care.  I discussed with him the fact that loss of 5 - 10% of his  current body weight will have the most impact on his diabetes management.  CDE Consult will be initiated . Exercise, and detailed carbohydrates information provided  -  detailed on discharge instructions.  5) Vit D def: I discussed and prescribed vit D 50, 000 units capsule weekly x 12 weeks. 5) Chronic Care/Health Maintenance:  -he   is encouraged  to initiate and continue to follow up with Ophthalmology, Dentist,  Podiatrist at least yearly or according to recommendations, and advised to  stay away from smoking. I have recommended yearly flu vaccine and pneumonia vaccine at least every 5 years; moderate intensity exercise for up to 150 minutes weekly; and  sleep for at least 7 hours a day.  - I advised patient to maintain close follow up with Avon GullyFanta, Tesfaye, MD for primary care needs.  - Time spent with the patient: 25 min, of which >50% was spent in reviewing his  current and  previous labs/studies, previous treatments, and medications doses and developing a plan for long-term care based on the latest recommendations for standards of care. Martin Stevenson participated in the discussions, expressed understanding, and voiced agreement with the above plans.  All questions were answered to his satisfaction. he is encouraged to contact clinic should he have any questions or concerns prior to his return visit.   Follow up plan: - Return in about 6 months (around 03/23/2019) for Follow up with Pre-visit Labs.  Marquis LunchGebre Eliyohu Class, MD [facility] [address] [place], [place] [address] Phone: [phone]  Fax: [phone]    09/22/2018, 1:01 PM  This note was partially dictated with voice recognition software. Similar sounding words can be transcribed inadequately or may not  be corrected upon review.

## 2018-09-22 NOTE — Progress Notes (Signed)
Pt has been taking Janumet along with Metformin and Januvia which was provided by another physician.

## 2018-09-23 ENCOUNTER — Ambulatory Visit: Payer: Medicaid Other | Admitting: Nurse Practitioner

## 2019-01-24 ENCOUNTER — Other Ambulatory Visit: Payer: Self-pay | Admitting: Neurology

## 2019-03-09 NOTE — Progress Notes (Signed)
PATIENT: Martin Stevenson DOB: Dec 17, 1985  REASON FOR VISIT: follow up HISTORY FROM: patient  HISTORY OF PRESENT ILLNESS: Today 03/10/19  Martin Stevenson is a 33 year old male with history of intractable seizure disorder and multiple sclerosis.  MRI of the brain in March 2019 showed lesions consistent with multiple sclerosis, lumbar puncture showed oligoclonal banding consistent with a diagnosis of MS.  MRI of the cervical spine was normal.  He remains on Tecfidera, since May 2019.  At last visit, his seizures were not under good control, his Depakote was increased to 1500 mg daily, he remains on Keppra 750 mg twice daily.  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  He had a very abnormal EEG in May 2019, showing significant irritability from the right frontotemporal area.  He could potentially be a candidate for epilepsy surgery if medications cannot control his seizures.  Today, he reports since last visit he has had 6 seizures.  The most recent occurred last night.  He says he will feel the seizure coming on, he will stop what he is doing, his wife describes that he will stare off, may say curse words, may last a total of 8 to 10 seconds.  He is able to quickly return to normal, he will say "Did I hurt anybody".  There is no full body shaking or falls.  There is no incontinence or oral injury.  He currently does not work, he is applying for disability.  He does not drive a car.  He is tolerating Tecfidera.  He reports he may have intermittent sensory alteration in his right hand.  Occasionally, he may have some joint aching.  He denies any trouble ambulating or falls.  He denies any changes to his vision, overtime his vision has become blurry.  He says at times he may have urinary frequency, but does not have incontinence.  He is compliant with his medications.  He presents today for follow-up unaccompanied.  HISTORY 1/30/2020[name], 33 year old male returns for follow-up  with intractable seizure disorder and newly diagnosed multiple sclerosis.  Patient reports that he has had several seizures since last seen the most recent was 3 days ago.  He states he has noticed a increase in his seizures for about 6 months now.  They are mild he has not gone to the emergency room.  He did not call our office to let us know.  He is currently on Depakote 500mg  twice daily  and Keppra 750 twice daily.  Last VPA level was 38.  Keppra level was 8.1 on Dec 29, 2017.  JC virus was elevated at 3.58.  He claims he has not been able to obtain  employment because of the seizures. MRI of the brain3/6/2019showed lesions consistent with multiple sclerosis, lumbar puncture showed oligoclonal banding consistent with a diagnosis of MS. MRI of the cervical spine did not show cord lesions.  He is currently on tecfidera and has been on tecfidera for approximately 6 months.  He has occasional hot flashes and was encouraged to take the medication on a full meal with protein.  He complains with some weakness in his arms.  He denies any falls.  He denies any visual disturbance.  He denies any loss of bowel or bladder control.  He denies any sensory changes speech or swallowing problems, he returns for reevaluation  REVIEW OF SYSTEMS: Out of a complete 14 system review of symptoms, the patient complains only of the following symptoms, and  all other reviewed systems are negative.  Joint pain, seizures  ALLERGIES: Allergies  Allergen Reactions  . Benazepril     States that it made his mouth burn  . Shellfish Allergy     HOME MEDICATIONS: Outpatient Medications Prior to Visit  Medication Sig Dispense Refill  . divalproex (DEPAKOTE) 500 MG DR tablet 1 in the AM and 2 in the PM 90 tablet 6  . sitaGLIPtin-metformin (JANUMET) 50-1000 MG tablet Take 1 tablet by mouth 2 (two) times daily with a meal.    . TECFIDERA 240 MG CPDR TAKE 1 CAPSULE BY MOUTH TWICE A DAY 60 capsule 11  . levETIRAcetam (KEPPRA) 750 MG  tablet Take 1 tablet (750 mg total) by mouth 2 (two) times daily. 180 tablet 1  . Vitamin D, Ergocalciferol, (DRISDOL) 1.25 MG (50000 UT) CAPS capsule Take 1 capsule (50,000 Units total) by mouth every 7 (seven) days. 12 capsule 0   No facility-administered medications prior to visit.     PAST MEDICAL HISTORY: Past Medical History:  Diagnosis Date  . Diabetes ([place])   . Fluid retention   . Hypertension   . Multiple sclerosis ([place]) 12/29/2017  . Seizures ([place])     PAST SURGICAL HISTORY: Past Surgical History:  Procedure Laterality Date  . MOUTH SURGERY     x3, jaw, hip, chin    FAMILY HISTORY: Family History  Problem Relation Age of Onset  . Diabetes Mother   . Pulmonary embolism Mother        hx of blood clots  . Diabetes Father     SOCIAL HISTORY: Social History   Socioeconomic History  . Marital status: Single    Spouse name: Not on file  . Number of children: 3  . Years of education: HS  . Highest education level: Not on file  Occupational History    Comment: Unemployed  Social Needs  . Financial resource strain: Not on file  . Food insecurity    Worry: Not on file    Inability: Not on file  . Transportation needs    Medical: Not on file    Non-medical: Not on file  Tobacco Use  . Smoking status: Never Smoker  . Smokeless tobacco: Never Used  Substance and Sexual Activity  . Alcohol use: No    Comment: Quit: 2012  . Drug use: No  . Sexual activity: Not on file  Lifestyle  . Physical activity    Days per week: Not on file    Minutes per session: Not on file  . Stress: Not on file  Relationships  . Social Herbalist on phone: Not on file    Gets together: Not on file    Attends religious service: Not on file    Active member of club or organization: Not on file    Attends meetings of clubs or organizations: Not on file    Relationship status: Not on file  . Intimate partner violence    Fear of current or ex partner: Not on file     Emotionally abused: Not on file    Physically abused: Not on file    Forced sexual activity: Not on file  Other Topics Concern  . Not on file  Social History Narrative   Pt lives at home with his spouse.   Caffeine Use: very little   Right handed     PHYSICAL EXAM  Vitals:   03/10/19 1017  BP: (!) 155/101  Pulse: 90  Temp: (!)  97.5 F (36.4 C)  Weight: 209 lb (94.8 kg)  Height: 5\' 7"  (1.702 m)   Body mass index is 32.73 kg/m.  Generalized: Well developed, in no acute distress   Neurological examination  Mentation: Alert oriented to time, place, history taking. Follows all commands speech and language fluent Cranial nerve II-XII: Pupils were equal round reactive to light. Extraocular movements were full, visual field were full on confrontational test. Facial sensation and strength were normal. Head turning and shoulder shrug  were normal and symmetric. Motor: The motor testing reveals 5 over 5 strength of all 4 extremities. Good symmetric motor tone is noted throughout.  Sensory: Sensory testing is intact to soft touch on all 4 extremities. No evidence of extinction is noted.  Coordination: Cerebellar testing reveals good finger-nose-finger and heel-to-shin bilaterally.  Gait and station: Gait is normal. Tandem gait is normal. Romberg is negative. No drift is seen.  Reflexes: Deep tendon reflexes are symmetric and normal bilaterally.   DIAGNOSTIC DATA (LABS, IMAGING, TESTING) - I reviewed patient records, labs, notes, testing and imaging myself where available.  Lab Results  Component Value Date   WBC 5.3 09/09/2018   HGB 14.3 09/09/2018   HCT 43.4 09/09/2018   MCV 82 09/09/2018   PLT 229 09/09/2018      Component Value Date/Time   NA 140 09/21/2018 1453   K 3.8 09/21/2018 1453   CL 99 09/21/2018 1453   CO2 23 09/21/2018 1453   GLUCOSE 165 (H) 09/21/2018 1453   GLUCOSE 126 (H) 07/04/2012 1555   BUN 12 09/21/2018 1453   CREATININE 0.92 09/21/2018 1453   CALCIUM  10.1 09/21/2018 1453   PROT 7.7 09/21/2018 1453   ALBUMIN 4.7 09/21/2018 1453   AST 12 09/21/2018 1453   ALT 15 09/21/2018 1453   ALKPHOS 40 09/21/2018 1453   BILITOT 0.3 09/21/2018 1453   GFRNONAA 108 09/21/2018 1453   GFRAA 125 09/21/2018 1453   Lab Results  Component Value Date   CHOL 198 09/21/2018   HDL 76 09/21/2018   LDLCALC 100 (H) 09/21/2018   TRIG 111 09/21/2018   Lab Results  Component Value Date   HGBA1C 6.2 (H) 09/21/2018   No results found for: VITAMINB12 Lab Results  Component Value Date   TSH 1.820 09/21/2018    ASSESSMENT AND PLAN 33 y.o. year old male  has a past medical history of Diabetes (HCC), Fluid retention, Hypertension, Multiple sclerosis (HCC) (12/29/2017), and Seizures (HCC). here with:  1.  Multiple sclerosis 2.  Seizures  He will continue taking Tecfidera for MS.  He is tolerating the medication.  I will check lab work today to include a CBC with differential, CMP.  In May 2019 he had a positive JCV 3.58, no need to recheck this at this time, but we will follow the absolute lymphocyte count.  He is not having any new symptoms related to his MS, he may have an occasional sensory alteration in his right hand.  He had initial MRI of the brain in March 2019, was consistent with findings typical of MS. MRI of the cervical spine was normal.  In regards to seizures, I will check a Depakote level today.  Given that since last visit, he has had 6 seizures, we will likely go up on the Depakote dosing if the level allows.  At current, he remains on Depakote 500 mg in the morning, 1000 in the evening.  If after 2 to 3 months, his seizures continue, he is to call  me, we can go up on the Keppra.  For now, he will continue taking Keppra 750 mg twice a day.  He does not drive a car.  Dr. Anne HahnWillis has indicated that if his seizures continue, he may be a candidate for epilepsy surgery.  He will follow-up in 6 months or sooner if needed.  I advised that if his symptoms  worsen or if he develops any new symptoms he should let us know.  I sent a refill of Keppra, I will refill Depakote once adjustments are made.  I spent 25 minutes with the patient. 50% of this time was spent discussing his plan of care.   Margie EgeSarah Kieara Schwark, AGNP-C, DNP 03/10/2019, 10:58 AM [facility] [address], [address] [place], [place] [address] [phone]

## 2019-03-10 ENCOUNTER — Encounter: Payer: Self-pay | Admitting: Neurology

## 2019-03-10 ENCOUNTER — Other Ambulatory Visit: Payer: Self-pay

## 2019-03-10 ENCOUNTER — Ambulatory Visit: Payer: Medicaid Other | Admitting: Neurology

## 2019-03-10 VITALS — BP 155/101 | HR 90 | Temp 97.5°F | Ht 67.0 in | Wt 209.0 lb

## 2019-03-10 DIAGNOSIS — R569 Unspecified convulsions: Secondary | ICD-10-CM | POA: Diagnosis not present

## 2019-03-10 DIAGNOSIS — G35 Multiple sclerosis: Secondary | ICD-10-CM | POA: Diagnosis not present

## 2019-03-10 MED ORDER — LEVETIRACETAM 750 MG PO TABS: 750.0000 mg | ORAL_TABLET | Freq: Two times a day (BID) | ORAL | 1 refills | Status: DC

## 2019-03-10 NOTE — Patient Instructions (Signed)
I will check lab work today, we will likely increase your Depakote dose, depending on the level. I will send in a refill at that time. If your seizures continue please call, we can go up on the Keppra dose, but will do 1 thing at a time. No driving until seizure free for 6 months.

## 2019-03-10 NOTE — Progress Notes (Signed)
I have read the note, and I agree with the clinical assessment and plan.  Charles K Willis   

## 2019-03-11 LAB — COMPREHENSIVE METABOLIC PANEL
ALT: 26 IU/L (ref 0–44)
AST: 18 IU/L (ref 0–40)
Albumin/Globulin Ratio: 1.7 (ref 1.2–2.2)
Albumin: 4.6 g/dL (ref 4.0–5.0)
Alkaline Phosphatase: 40 IU/L (ref 39–117)
BUN/Creatinine Ratio: 11 (ref 9–20)
BUN: 10 mg/dL (ref 6–20)
Bilirubin Total: 0.2 mg/dL (ref 0.0–1.2)
CO2: 21 mmol/L (ref 20–29)
Calcium: 9.3 mg/dL (ref 8.7–10.2)
Chloride: 102 mmol/L (ref 96–106)
Creatinine, Ser: 0.94 mg/dL (ref 0.76–1.27)
GFR calc Af Amer: 122 mL/min/{1.73_m2} (ref >59)
GFR calc non Af Amer: 105 mL/min/{1.73_m2} (ref >59)
Globulin, Total: 2.7 g/dL (ref 1.5–4.5)
Glucose: 202 mg/dL — ABNORMAL HIGH (ref 65–99)
Potassium: 4.5 mmol/L (ref 3.5–5.2)
Sodium: 140 mmol/L (ref 134–144)
Total Protein: 7.3 g/dL (ref 6.0–8.5)

## 2019-03-11 LAB — CBC WITH DIFFERENTIAL/PLATELET
Basophils Absolute: 0 10*3/uL (ref 0.0–0.2)
Basos: 0 %
EOS (ABSOLUTE): 0.1 10*3/uL (ref 0.0–0.4)
Eos: 1 %
Hematocrit: 41.4 % (ref 37.5–51.0)
Hemoglobin: 13.7 g/dL (ref 13.0–17.7)
Immature Grans (Abs): 0 10*3/uL (ref 0.0–0.1)
Immature Granulocytes: 0 %
Lymphocytes Absolute: 2.1 10*3/uL (ref 0.7–3.1)
Lymphs: 43 %
MCH: 27.6 pg (ref 26.6–33.0)
MCHC: 33.1 g/dL (ref 31.5–35.7)
MCV: 84 fL (ref 79–97)
Monocytes Absolute: 0.4 10*3/uL (ref 0.1–0.9)
Monocytes: 8 %
Neutrophils Absolute: 2.3 10*3/uL (ref 1.4–7.0)
Neutrophils: 48 %
Platelets: 208 10*3/uL (ref 150–450)
RBC: 4.96 x10E6/uL (ref 4.14–5.80)
RDW: 13.6 % (ref 11.6–15.4)
WBC: 5 10*3/uL (ref 3.4–10.8)

## 2019-03-11 LAB — VALPROIC ACID LEVEL: Valproic Acid Lvl: 71 ug/mL (ref 50–100)

## 2019-03-14 ENCOUNTER — Telehealth: Payer: Self-pay | Admitting: *Deleted

## 2019-03-14 NOTE — Telephone Encounter (Signed)
-----   Message from Suzzanne Cloud, NP sent at 03/14/2019  8:04 AM EDT ----- Please call the patient. Have him increase his Depakote 500 mg tablets, take 2 tablets twice day. He has continued to have seizures, therefore we are increasing the dose. Other lab work looks good while on [employer]. Glucose was elevated at 202.

## 2019-03-14 NOTE — Telephone Encounter (Signed)
I called pt and relayed that per Butler Denmark, NP lab results per below. depakote level was 71 (with therapeutic range). Due to seizures, will increase depakote to 1000mg  po bid.  Other labs look good, glucose 202, (he had eaten that day)he verbalized understanding.

## 2019-03-23 ENCOUNTER — Ambulatory Visit: Payer: Medicaid Other | Admitting: "Endocrinology

## 2019-04-05 ENCOUNTER — Ambulatory Visit: Payer: Medicaid Other | Admitting: "Endocrinology

## 2019-04-06 LAB — HEPATIC FUNCTION PANEL
ALT: 32 (ref 10–40)
AST: 27 (ref 14–40)
Alkaline Phosphatase: 45 (ref 25–125)
Bilirubin, Total: 0.3

## 2019-04-06 LAB — HEMOGLOBIN A1C: Hemoglobin A1C: 7.6

## 2019-04-06 LAB — LIPID PANEL
Cholesterol: 201 — AB (ref 0–200)
HDL: 75 — AB (ref 35–70)
LDL Cholesterol: 108
Triglycerides: 93 (ref 40–160)

## 2019-04-06 LAB — BASIC METABOLIC PANEL
BUN: 11 (ref 4–21)
Creatinine: 0.9 (ref <=1.3)
Glucose: 152
Potassium: 4 (ref 3.4–5.3)
Sodium: 141 (ref 137–147)

## 2019-04-20 ENCOUNTER — Other Ambulatory Visit: Payer: Self-pay

## 2019-04-20 ENCOUNTER — Other Ambulatory Visit: Payer: Self-pay | Admitting: Internal Medicine

## 2019-04-20 ENCOUNTER — Encounter: Payer: Self-pay | Admitting: "Endocrinology

## 2019-04-20 ENCOUNTER — Ambulatory Visit (INDEPENDENT_AMBULATORY_CARE_PROVIDER_SITE_OTHER): Payer: Medicaid Other | Admitting: "Endocrinology

## 2019-04-20 DIAGNOSIS — E559 Vitamin D deficiency, unspecified: Secondary | ICD-10-CM

## 2019-04-20 DIAGNOSIS — E1165 Type 2 diabetes mellitus with hyperglycemia: Secondary | ICD-10-CM

## 2019-04-20 MED ORDER — VITAMIN D3 125 MCG (5000 UT) PO CAPS: 5000.0000 [IU] | ORAL_CAPSULE | Freq: Every day | ORAL | 0 refills | Status: DC

## 2019-04-20 NOTE — Progress Notes (Signed)
04/20/2019, 5:30 PM                                                     Endocrinology Telehealth Visit Follow up Note -During COVID -19 Pandemic  This visit type was conducted due to national recommendations for restrictions regarding the COVID-19 Pandemic  in an effort to limit this patient's exposure and mitigate transmission of the corona virus.  Due to his co-morbid illnesses, Martin Stevenson is at  moderate to high risk for complications without adequate follow up.  This format is felt to be most appropriate for him at this time.  I connected with this patient on 04/20/2019   by telephone and verified that I am speaking with the correct person using two identifiers. Martin Stevenson, 1986/04/03. he has verbally consented to this visit. All issues noted in this document were discussed and addressed. The format was not optimal for physical exam.   Subjective:    Patient ID: Martin Stevenson, male    DOB: 1986-08-02.  [name] is being engaged in telehealth via telephone in f/u for management of currently uncontrolled symptomatic diabetes requested by  [name], MD.   Past Medical History:  Diagnosis Date  . Diabetes ([place])   . Fluid retention   . Hypertension   . Multiple sclerosis ([place]) 12/29/2017  . Seizures ([place])    Past Surgical History:  Procedure Laterality Date  . MOUTH SURGERY     x3, jaw, hip, chin   Social History   Socioeconomic History  . Marital status: Single    Spouse name: Not on file  . Number of children: 3  . Years of education: HS  . Highest education level: Not on file  Occupational History    Comment: Unemployed  Social Needs  . Financial resource strain: Not on file  . Food insecurity    Worry: Not on file    Inability: Not on file  . Transportation needs    Medical: Not on file    Non-medical: Not on file  Tobacco Use  . Smoking status: Never Smoker  . Smokeless  tobacco: Never Used  Substance and Sexual Activity  . Alcohol use: No    Comment: Quit: 2012  . Drug use: No  . Sexual activity: Not on file  Lifestyle  . Physical activity    Days per week: Not on file    Minutes per session: Not on file  . Stress: Not on file  Relationships  . Social Herbalist on phone: Not on file    Gets together: Not on file    Attends religious service: Not on file    Active member of club or organization: Not on file    Attends meetings of clubs or organizations: Not on file    Relationship status: Not on file  Other Topics Concern  . Not on file  Social History Narrative   Pt lives at home with his spouse.   Caffeine Use: very little  Right handed    Outpatient Encounter Medications as of 04/20/2019  Medication Sig  . Cholecalciferol (VITAMIN D3) 125 MCG (5000 UT) CAPS Take 1 capsule (5,000 Units total) by mouth daily.  . divalproex (DEPAKOTE) 500 MG DR tablet 1 in the AM and 2 in the PM  . levETIRAcetam (KEPPRA) 750 MG tablet Take 1 tablet (750 mg total) by mouth 2 (two) times daily.  . sitaGLIPtin-metformin (JANUMET) 50-1000 MG tablet Take 1 tablet by mouth 2 (two) times daily with a meal.  . TECFIDERA 240 MG CPDR TAKE 1 CAPSULE BY MOUTH TWICE A DAY   No facility-administered encounter medications on file as of 04/20/2019.     ALLERGIES: Allergies  Allergen Reactions  . Benazepril     States that it made his mouth burn  . Shellfish Allergy     VACCINATION STATUS:  There is no immunization history on file for this patient.  Diabetes He presents for his follow-up diabetic visit. He has type 2 diabetes mellitus. Onset time: He was diagnosed at approximate age of 33 years. His disease course has been worsening. There are no hypoglycemic associated symptoms. Pertinent negatives for hypoglycemia include no confusion, headaches, pallor or seizures. There are no diabetic associated symptoms. Pertinent negatives for diabetes include no chest  pain, no fatigue, no polydipsia, no polyphagia, no polyuria and no weakness. There are no hypoglycemic complications. Symptoms are worsening. There are no diabetic complications. Risk factors for coronary artery disease include diabetes mellitus, male sex, obesity and sedentary lifestyle. Current diabetic treatments: He is on Janumet 50/1000 mg p.o. twice daily. Since his last visit, he received separate rx for MTF and Januvia by other providers. He is following a generally unhealthy diet. When asked about meal planning, he reported none. He has not had a previous visit with a dietitian. He rarely participates in exercise. His breakfast blood glucose range is generally 140-180 mg/dl. His overall blood glucose range is 140-180 mg/dl. An ACE inhibitor/angiotensin II receptor blocker is not being taken. He does not see a podiatrist.Eye exam is not current.    Objective:    There were no vitals taken for this visit.  Wt Readings from Last 3 Encounters:  03/10/19 209 lb (94.8 kg)  09/22/18 209 lb (94.8 kg)  09/09/18 209 lb (94.8 kg)     CMP   Recent Results (from the past 2160 hour(s))  Valproic Acid Level     Status: None   Collection Time: 03/10/19 12:42 PM  Result Value Ref Range   Valproic Acid Lvl 71 50 - 100 ug/mL    Comment:                                 Detection Limit = 4                            <4 indicates None Detected Toxicity may occur at levels of 100-500. Measurements of free unbound valproic acid may improve the assess- ment of clinical response.   CBC with Differential/Platelet     Status: None   Collection Time: 03/10/19 12:42 PM  Result Value Ref Range   WBC 5.0 3.4 - 10.8 x10E3/uL   RBC 4.96 4.14 - 5.80 x10E6/uL   Hemoglobin 13.7 13.0 - 17.7 g/dL   Hematocrit 16.141.4 09.637.5 - 51.0 %   MCV 84 79 - 97 fL   MCH 27.6 26.6 -  33.0 pg   MCHC 33.1 31.5 - 35.7 g/dL   RDW 16.113.6 09.611.6 - 04.515.4 %   Platelets 208 150 - 450 x10E3/uL   Neutrophils 48 Not Estab. %   Lymphs 43 Not  Estab. %   Monocytes 8 Not Estab. %   Eos 1 Not Estab. %   Basos 0 Not Estab. %   Neutrophils Absolute 2.3 1.4 - 7.0 x10E3/uL   Lymphocytes Absolute 2.1 0.7 - 3.1 x10E3/uL   Monocytes Absolute 0.4 0.1 - 0.9 x10E3/uL   EOS (ABSOLUTE) 0.1 0.0 - 0.4 x10E3/uL   Basophils Absolute 0.0 0.0 - 0.2 x10E3/uL   Immature Granulocytes 0 Not Estab. %   Immature Grans (Abs) 0.0 0.0 - 0.1 x10E3/uL  CMP     Status: Abnormal   Collection Time: 03/10/19 12:42 PM  Result Value Ref Range   Glucose 202 (H) 65 - 99 mg/dL   BUN 10 6 - 20 mg/dL   Creatinine, Ser 4.090.94 0.76 - 1.27 mg/dL   GFR calc non Af Amer 105 >59 mL/min/1.73   GFR calc Af Amer 122 >59 mL/min/1.73   BUN/Creatinine Ratio 11 9 - 20   Sodium 140 134 - 144 mmol/L   Potassium 4.5 3.5 - 5.2 mmol/L   Chloride 102 96 - 106 mmol/L   CO2 21 20 - 29 mmol/L   Calcium 9.3 8.7 - 10.2 mg/dL   Total Protein 7.3 6.0 - 8.5 g/dL   Albumin 4.6 4.0 - 5.0 g/dL   Globulin, Total 2.7 1.5 - 4.5 g/dL   Albumin/Globulin Ratio 1.7 1.2 - 2.2   Bilirubin Total 0.2 0.0 - 1.2 mg/dL   Alkaline Phosphatase 40 39 - 117 IU/L   AST 18 0 - 40 IU/L   ALT 26 0 - 44 IU/L  Basic metabolic panel     Status: None   Collection Time: 04/06/19 12:00 AM  Result Value Ref Range   Glucose 152    BUN 11 4 - 21   Creatinine 0.9 0.6 - 1.3   Potassium 4.0 3.4 - 5.3   Sodium 141 137 - 147  Lipid panel     Status: Abnormal   Collection Time: 04/06/19 12:00 AM  Result Value Ref Range   Triglycerides 93 40 - 160   Cholesterol 201 (A) 0 - 200   HDL 75 (A) 35 - 70   LDL Cholesterol 108   Hepatic function panel     Status: None   Collection Time: 04/06/19 12:00 AM  Result Value Ref Range   Alkaline Phosphatase 45 25 - 125   ALT 32 10 - 40   AST 27 14 - 40   Bilirubin, Total 0.3   Hemoglobin A1c     Status: None   Collection Time: 04/06/19 12:00 AM  Result Value Ref Range   Hemoglobin A1C 7.6      Assessment & Plan:   1. Uncontrolled type 2 diabetes mellitus with  hyperglycemia (HCC)  - Hawk Teeple has currently uncontrolled symptomatic type 2 DM since 33 years of age. - His previsit labs show A1c increasing to 7.6% from 6.2%.    -He does not report any gross complications from his diabetes, however patient with multiple sclerosis as well as seizure disorders,  and he remains at a high risk for more acute and chronic complications which include CAD, CVA, CKD, retinopathy, and neuropathy. These are all discussed in detail with him.  - I have counseled him on diet management and weight loss, by  adopting a carbohydrate restricted/protein rich diet.  - he  admits there is a room for improvement in his diet and drink choices. -  Suggestion is made for him to avoid simple carbohydrates  from his diet including Cakes, Sweet Desserts / Pastries, Ice Cream, Soda (diet and regular), Sweet Tea, Candies, Chips, Cookies, Sweet Pastries,  Store Bought Juices, Alcohol in Excess of  1-2 drinks a day, Artificial Sweeteners, Coffee Creamer, and "Sugar-free" Products. This will help patient to have stable blood glucose profile and potentially avoid unintended weight gain.   - I encouraged him to switch to  unprocessed or minimally processed complex starch and increased protein intake (animal or plant source), fruits, and vegetables.  - he is advised to stick to a routine mealtimes to eat 3 meals  a day and avoid unnecessary snacks ( to snack only to correct hypoglycemia).    -He will not require insulin treatment immediately.    -He is advised to continue  Janumet 50/1000 mg p.o. twice daily, therapeutically suitable for the patient. -Will be considered for low-dose glipizide if his A1c increases to above 8% next visit.  - Patient specific target  A1c;  LDL, HDL, Triglycerides, and  Waist Circumference were discussed in detail.  2) BP/HTN: he is advised to home monitor blood pressure and report if > 140/90 on 2 separate readings.  He is not on antihypertensive  medications currently.  3) Lipids/HPL: LDL is 100, not on statin. Will be observed for nanother fasting lipid panel.  4)  Weight/Diet:  There is no height or weight on file to calculate BMI.  - clearly complicating his diabetes care.  I discussed with him the fact that loss of 5 - 10% of his  current body weight will have the most impact on his diabetes management.  CDE Consult will be initiated . Exercise, and detailed carbohydrates information provided  -  detailed on discharge instructions.  5) Vit D def: -He is advised to continue vitamin D3 supplement with 5000 units daily for the next 90 days.     5) Chronic Care/Health Maintenance:  -he   is encouraged to initiate and continue to follow up with Ophthalmology, Dentist,  Podiatrist at least yearly or according to recommendations, and advised to  stay away from smoking. I have recommended yearly flu vaccine and pneumonia vaccine at least every 5 years; moderate intensity exercise for up to 150 minutes weekly; and  sleep for at least 7 hours a day.  - I advised patient to maintain close follow up with [place] GullyFanta, Tesfaye, MD for primary care needs.  - Patient Care Time Today:  25 min, of which >50% was spent in  counseling and the rest reviewing his  current and  previous labs/studies, previous treatments, his blood glucose readings, and medications' doses and developing a plan for long-term care based on the latest recommendations for standards of care.   Filip Swier participated in the discussions, expressed understanding, and voiced agreement with the above plans.  All questions were answered to his satisfaction. he is encouraged to contact clinic should he have any questions or concerns prior to his return visit.   Follow up plan: - Return in about 4 months (around 08/20/2019) for Bring Meter and Logs- A1c in Office.  Marquis LunchGebre , MD [facility] [address] [place], [place]  [address] Phone: [phone]  Fax: [phone]    04/20/2019, 5:30 PM  This note was partially dictated with voice recognition  software. Similar sounding words can be transcribed inadequately or may not  be corrected upon review.

## 2019-05-25 ENCOUNTER — Telehealth: Payer: Self-pay | Admitting: Neurology

## 2019-05-25 MED ORDER — DIMETHYL FUMARATE 240 MG PO CPDR: 1.0000 | DELAYED_RELEASE_CAPSULE | Freq: Two times a day (BID) | ORAL | 11 refills | Status: DC

## 2019-05-25 NOTE — Addendum Note (Signed)
Addended by: Thomes Cake on: 05/25/2019 11:50 AM   Modules accepted: Orders

## 2019-05-25 NOTE — Telephone Encounter (Signed)
Pt has called for a refill on his [place] ([facility]) [phone]

## 2019-06-21 ENCOUNTER — Other Ambulatory Visit: Payer: Self-pay | Admitting: *Deleted

## 2019-06-21 DIAGNOSIS — G35D Multiple sclerosis, unspecified: Secondary | ICD-10-CM

## 2019-06-21 DIAGNOSIS — G35 Multiple sclerosis: Secondary | ICD-10-CM

## 2019-06-21 DIAGNOSIS — Z5181 Encounter for therapeutic drug level monitoring: Secondary | ICD-10-CM

## 2019-06-21 DIAGNOSIS — R569 Unspecified convulsions: Secondary | ICD-10-CM

## 2019-06-21 MED ORDER — DIVALPROEX SODIUM 500 MG PO DR TAB: 1000.0000 mg | DELAYED_RELEASE_TABLET | Freq: Two times a day (BID) | ORAL | 6 refills | Status: DC

## 2019-08-18 ENCOUNTER — Telehealth: Payer: Self-pay | Admitting: "Endocrinology

## 2019-08-18 MED ORDER — SITAGLIPTIN PHOS-METFORMIN HCL 50-1000 MG PO TABS: 1.0000 | ORAL_TABLET | Freq: Two times a day (BID) | ORAL | 1 refills | Status: DC

## 2019-08-18 NOTE — Telephone Encounter (Signed)
Rx sent 

## 2019-08-18 NOTE — Telephone Encounter (Signed)
Pt requesting refill on sitaGLIPtin-metformin (JANUMET) 50-1000 MG tablet, [facility] [place]

## 2019-08-22 ENCOUNTER — Ambulatory Visit: Payer: Medicaid Other | Admitting: "Endocrinology

## 2019-08-22 ENCOUNTER — Ambulatory Visit (INDEPENDENT_AMBULATORY_CARE_PROVIDER_SITE_OTHER): Payer: Medicaid Other | Admitting: "Endocrinology

## 2019-08-22 ENCOUNTER — Encounter: Payer: Self-pay | Admitting: "Endocrinology

## 2019-08-22 ENCOUNTER — Other Ambulatory Visit: Payer: Self-pay

## 2019-08-22 VITALS — BP 150/92 | HR 80 | Ht 67.0 in | Wt 207.4 lb

## 2019-08-22 DIAGNOSIS — E559 Vitamin D deficiency, unspecified: Secondary | ICD-10-CM

## 2019-08-22 DIAGNOSIS — E782 Mixed hyperlipidemia: Secondary | ICD-10-CM | POA: Insufficient documentation

## 2019-08-22 DIAGNOSIS — E1165 Type 2 diabetes mellitus with hyperglycemia: Secondary | ICD-10-CM

## 2019-08-22 DIAGNOSIS — I10 Essential (primary) hypertension: Secondary | ICD-10-CM

## 2019-08-22 LAB — POCT GLYCOSYLATED HEMOGLOBIN (HGB A1C): Hemoglobin A1C: 7.5 % — AB (ref 4.0–5.6)

## 2019-08-22 MED ORDER — HYDROCHLOROTHIAZIDE 25 MG PO TABS: 25.0000 mg | ORAL_TABLET | Freq: Every day | ORAL | 1 refills | Status: DC

## 2019-08-22 MED ORDER — GLIPIZIDE ER 5 MG PO TB24: 5.0000 mg | ORAL_TABLET | Freq: Every day | ORAL | 3 refills | Status: DC

## 2019-08-22 MED ORDER — VITAMIN D3 125 MCG (5000 UT) PO CAPS: 5000.0000 [IU] | ORAL_CAPSULE | Freq: Every day | ORAL | 0 refills | Status: DC

## 2019-08-22 NOTE — Progress Notes (Signed)
08/22/2019, 8:27 PM   Endocrinology follow-up note   Subjective:    Patient ID: Martin Stevenson, male    DOB: Dec 10, 1985.  Martin Stevenson is being seen in f/u for management of currently uncontrolled symptomatic diabetes requested by  [name], MD.   Past Medical History:  Diagnosis Date  . Diabetes ([place])   . Fluid retention   . Hypertension   . Multiple sclerosis ([place]) 12/29/2017  . Seizures ([place])    Past Surgical History:  Procedure Laterality Date  . MOUTH SURGERY     x3, jaw, hip, chin   Social History   Socioeconomic History  . Marital status: Single    Spouse name: Not on file  . Number of children: 3  . Years of education: HS  . Highest education level: Not on file  Occupational History    Comment: Unemployed  Tobacco Use  . Smoking status: Never Smoker  . Smokeless tobacco: Never Used  Substance and Sexual Activity  . Alcohol use: No    Comment: Quit: 2012  . Drug use: No  . Sexual activity: Not on file  Other Topics Concern  . Not on file  Social History Narrative   Pt lives at home with his spouse.   Caffeine Use: very little   Right handed    Social Determinants of Health   Financial Resource Strain:   . Difficulty of Paying Living Expenses: Not on file  Food Insecurity:   . Worried About Charity fundraiser in the Last Year: Not on file  . Ran Out of Food in the Last Year: Not on file  Transportation Needs:   . Lack of Transportation (Medical): Not on file  . Lack of Transportation (Non-Medical): Not on file  Physical Activity:   . Days of Exercise per Week: Not on file  . Minutes of Exercise per Session: Not on file  Stress:   . Feeling of Stress : Not on file  Social Connections:   . Frequency of Communication with Friends and Family: Not on file  . Frequency of Social Gatherings with Friends and Family: Not on file  . Attends Religious Services: Not on  file  . Active Member of Clubs or Organizations: Not on file  . Attends Archivist Meetings: Not on file  . Marital Status: Not on file   Outpatient Encounter Medications as of 08/22/2019  Medication Sig  . Cholecalciferol (VITAMIN D3) 125 MCG (5000 UT) CAPS Take 1 capsule (5,000 Units total) by mouth daily.  . Dimethyl Fumarate (TECFIDERA) 240 MG CPDR Take 1 capsule (240 mg total) by mouth 2 (two) times daily.  . divalproex (DEPAKOTE) 500 MG DR tablet Take 2 tablets (1,000 mg total) by mouth 2 (two) times daily.  Marland Kitchen glipiZIDE (GLUCOTROL XL) 5 MG 24 hr tablet Take 1 tablet (5 mg total) by mouth daily with breakfast.  . levETIRAcetam (KEPPRA) 750 MG tablet Take 1 tablet (750 mg total) by mouth 2 (two) times daily.  . sitaGLIPtin-metformin (JANUMET) 50-1000 MG tablet Take 1 tablet by mouth 2 (two) times daily with a meal.  . [DISCONTINUED] Cholecalciferol (VITAMIN D3) 125 MCG (5000 UT) CAPS  Take 1 capsule (5,000 Units total) by mouth daily.   No facility-administered encounter medications on file as of 08/22/2019.    ALLERGIES: Allergies  Allergen Reactions  . Benazepril     States that it made his mouth burn  . Shellfish Allergy     VACCINATION STATUS:  There is no immunization history on file for this patient.  Diabetes He presents for his follow-up diabetic visit. He has type 2 diabetes mellitus. Onset time: He was diagnosed at approximate age of 34 years. His disease course has been stable. There are no hypoglycemic associated symptoms. Pertinent negatives for hypoglycemia include no confusion, headaches, pallor or seizures. There are no diabetic associated symptoms. Pertinent negatives for diabetes include no chest pain, no fatigue, no polydipsia, no polyphagia, no polyuria and no weakness. There are no hypoglycemic complications. Symptoms are stable. There are no diabetic complications. Risk factors for coronary artery disease include diabetes mellitus, male sex, obesity and  sedentary lifestyle. Current diabetic treatments: He is on Janumet 50/1000 mg p.o. twice daily.  His weight is stable. He is following a generally unhealthy diet. When asked about meal planning, he reported none. He has not had a previous visit with a dietitian. He rarely participates in exercise. An ACE inhibitor/angiotensin II receptor blocker is not being taken. He does not see a podiatrist.Eye exam is not current.   Constitutional:  + Minimally fluctuating weight, no fatigue, no subjective hyperthermia, no subjective hypothermia Eyes: no blurry vision, no xerophthalmia ENT: no sore throat, no nodules palpated in throat, no dysphagia/odynophagia, no hoarseness Cardiovascular: no Chest Pain, no Shortness of Breath, no palpitations, no leg swelling Respiratory: no cough, no SOB Gastrointestinal: no Nausea/Vomiting/Diarhhea Musculoskeletal: no muscle/joint aches Skin: no rashes Neurological: no tremors, no numbness, no tingling, no dizziness Psychiatric: no depression, no anxiety   Objective:    BP (!) 150/92   Pulse 80   Ht 5\' 7"  (1.702 m)   Wt 207 lb 6.4 oz (94.1 kg)   BMI 32.48 kg/m   Wt Readings from Last 3 Encounters:  08/22/19 207 lb 6.4 oz (94.1 kg)  03/10/19 209 lb (94.8 kg)  09/22/18 209 lb (94.8 kg)     Physical Exam- Limited  Constitutional:  Body mass index is 32.48 kg/m. , not in acute distress, normal state of mind Eyes:  EOMI, no exophthalmos Neck: Supple Respiratory: Adequate breathing efforts Musculoskeletal: no gross deformities, strength intact in all four extremities, no gross restriction of joint movements Skin:  no rashes, no hyperemia Neurological: no tremor with outstretched hands.   CMP   Recent Results (from the past 2160 hour(s))  HgB A1c     Status: Abnormal   Collection Time: 08/22/19 10:45 AM  Result Value Ref Range   Hemoglobin A1C 7.5 (A) 4.0 - 5.6 %   HbA1c POC (<> result, manual entry)     HbA1c, POC (prediabetic range)     HbA1c, POC  (controlled diabetic range)     Point of care A1c: 7.5%  Assessment & Plan:   1. Uncontrolled type 2 diabetes mellitus with hyperglycemia (HCC)  - Martin Stevenson has currently uncontrolled symptomatic type 2 DM since 34 years of age. - His previsit labs show stable A1c of 7.5%.    -He does not report any gross complications from his diabetes, however patient with multiple sclerosis as well as seizure disorders,  and he remains at a high risk for more acute and chronic complications which include CAD, CVA, CKD, retinopathy, and neuropathy. These  are all discussed in detail with him.  - I have counseled him on diet management and weight loss, by adopting a carbohydrate restricted/protein rich diet.  - he  admits there is a room for improvement in his diet and drink choices. -  Suggestion is made for him to avoid simple carbohydrates  from his diet including Cakes, Sweet Desserts / Pastries, Ice Cream, Soda (diet and regular), Sweet Tea, Candies, Chips, Cookies, Sweet Pastries,  Store Bought Juices, Alcohol in Excess of  1-2 drinks a day, Artificial Sweeteners, Coffee Creamer, and "Sugar-free" Products. This will help patient to have stable blood glucose profile and potentially avoid unintended weight gain.    - I encouraged him to switch to  unprocessed or minimally processed complex starch and increased protein intake (animal or plant source), fruits, and vegetables.  - he is advised to stick to a routine mealtimes to eat 3 meals  a day and avoid unnecessary snacks ( to snack only to correct hypoglycemia).    -Based on his presentation with near target A1c of 7.5%, he will not require insulin treatment for now.  -He is advised to continue  Janumet 50/1000 mg p.o. twice daily, therapeutically suitable for the patient. -He is initiated on low-dose glipizide, 5 mg p.o. daily at breakfast.   - Patient specific target  A1c;  LDL, HDL, Triglycerides, and  Waist Circumference were discussed in  detail.  2) BP/HTN:  His blood pressure is not controlled.  He is allergic to ACE inhibitors.  I discussed and initiated hydrochlorothiazide 25 mg p.o. daily at breakfast.   3) Lipids/HPL: LDL is 100, not on statin.  He will have fasting lipid panel before his next visit.    4)  Weight/Diet:  Body mass index is 32.48 kg/m.  - clearly complicating his diabetes care.  I discussed with him the fact that loss of 5 - 10% of his  current body weight will have the most impact on his diabetes management.  CDE Consult will be initiated . Exercise, and detailed carbohydrates information provided  -  detailed on discharge instructions.  5) vitamin D deficiency: He did not pick up the vitamin D supplement prescribed for him during last visit.  I urged him to pick up vitamin D3 5000 units daily and take it for the next 90 days.    5) Chronic Care/Health Maintenance:  -he   is encouraged to initiate and continue to follow up with Ophthalmology, Dentist,  Podiatrist at least yearly or according to recommendations, and advised to  stay away from smoking. I have recommended yearly flu vaccine and pneumonia vaccine at least every 5 years; moderate intensity exercise for up to 150 minutes weekly; and  sleep for at least 7 hours a day.  - I advised patient to maintain close follow up with Avon Gully, MD for primary care needs.  - Time spent on this patient care encounter:  35 min, of which > 50% was spent in  counseling and the rest discussing his hypoglycemia and hyperglycemia episodes, reviewing his current and  previous labs / studies  ( including abstraction from other facilities) and medications  doses and developing a  long term treatment plan and documenting his care.   Please refer to Patient Instructions for Blood Glucose Monitoring and Insulin/Medications Dosing Guide"  in media tab for additional information. Please  also refer to " Patient Self Inventory" in the Media  tab for reviewed elements of  pertinent patient history.  Martin Stevenson  participated in the discussions, expressed understanding, and voiced agreement with the above plans.  All questions were answered to his satisfaction. he is encouraged to contact clinic should he have any questions or concerns prior to his return visit.   Follow up plan: - Return in about 4 months (around 12/20/2019) for Follow up with Pre-visit Labs, Next Visit A1c in Office.  Marquis Lunch, MD [facility] [address] [place], [place] [address] Phone: [phone]  Fax: [phone]    08/22/2019, 8:27 PM  This note was partially dictated with voice recognition software. Similar sounding words can be transcribed inadequately or may not  be corrected upon review.

## 2019-08-22 NOTE — Patient Instructions (Signed)

## 2019-09-15 ENCOUNTER — Ambulatory Visit: Payer: Medicaid Other | Admitting: Neurology

## 2019-09-15 ENCOUNTER — Encounter: Payer: Self-pay | Admitting: Neurology

## 2019-09-15 ENCOUNTER — Telehealth: Payer: Self-pay | Admitting: Neurology

## 2019-09-15 ENCOUNTER — Other Ambulatory Visit: Payer: Self-pay

## 2019-09-15 VITALS — BP 150/96 | HR 91 | Temp 97.6°F | Ht 67.0 in | Wt 208.0 lb

## 2019-09-15 DIAGNOSIS — G35 Multiple sclerosis: Secondary | ICD-10-CM

## 2019-09-15 DIAGNOSIS — Z5181 Encounter for therapeutic drug level monitoring: Secondary | ICD-10-CM | POA: Diagnosis not present

## 2019-09-15 DIAGNOSIS — R569 Unspecified convulsions: Secondary | ICD-10-CM

## 2019-09-15 MED ORDER — LEVETIRACETAM 750 MG PO TABS: ORAL_TABLET | ORAL | 1 refills | Status: DC

## 2019-09-15 NOTE — Patient Instructions (Signed)
Go up on the Keppra dose to 750 mg in the morning and 1500 mg in the evening.

## 2019-09-15 NOTE — Telephone Encounter (Signed)
Medicaid order sent to [facility]. They will obtain the auth and reach out to the patient to schedule.  

## 2019-09-15 NOTE — Progress Notes (Signed)
Reason for visit: Seizures, multiple sclerosis  Martin Stevenson is an 34 y.o. male  History of present illness:  Martin Stevenson is a 34 year old right-handed black male with a history of multiple sclerosis and intractable seizures.  The patient is on Depakote and Keppra, he continues to have seizures with some regularity, he had 2 seizures yesterday, he may go up to 3 weeks without any seizures but usually does not go an entire month without an event.  The seizures are associated with impaired consciousness and behavioral manifestations, the patient has had evidence of right frontotemporal abnormalities on EEG study.  The patient claims that he has chronic problems with insomnia.  He is on Tecfidera for his multiple sclerosis, he tolerates this well.  He will have occasional sharp pains in the hands and in the left knee that comes and goes.  The patient reports no new vision changes, numbness or weakness of the extremities.  The last MRI of the brain was done in 2019.  He returns for an evaluation.  He has diabetes, his hemoglobin A1c is 7.5.  He does have a family history of seizures.  Past Medical History:  Diagnosis Date  . Diabetes ([place])   . Fluid retention   . Hypertension   . Multiple sclerosis ([place]) 12/29/2017  . Seizures ([place])     Past Surgical History:  Procedure Laterality Date  . MOUTH SURGERY     x3, jaw, hip, chin    Family History  Problem Relation Age of Onset  . Diabetes Mother   . Pulmonary embolism Mother        hx of blood clots  . Diabetes Father     Social history:  reports that he has never smoked. He has never used smokeless tobacco. He reports that he does not drink alcohol or use drugs.    Allergies  Allergen Reactions  . Benazepril     States that it made his mouth burn  . Shellfish Allergy     Medications:  Prior to Admission medications   Medication Sig Start Date End Date Taking? Authorizing Provider  Cholecalciferol (VITAMIN D3) 125 MCG (5000  UT) CAPS Take 1 capsule (5,000 Units total) by mouth daily. 08/22/19  Yes Nida, Marella Chimes, MD  Dimethyl Fumarate (TECFIDERA) 240 MG CPDR Take 1 capsule (240 mg total) by mouth 2 (two) times daily. 05/25/19  Yes Suzzanne Cloud, NP  divalproex (DEPAKOTE) 500 MG DR tablet Take 2 tablets (1,000 mg total) by mouth 2 (two) times daily. 06/21/19  Yes Suzzanne Cloud, NP  glipiZIDE (GLUCOTROL XL) 5 MG 24 hr tablet Take 1 tablet (5 mg total) by mouth daily with breakfast. 08/22/19  Yes Nida, Marella Chimes, MD  hydrochlorothiazide (HYDRODIURIL) 25 MG tablet Take 1 tablet (25 mg total) by mouth daily. 08/22/19  Yes Cassandria Anger, MD  levETIRAcetam (KEPPRA) 750 MG tablet Take 1 tablet (750 mg total) by mouth 2 (two) times daily. 03/10/19  Yes Suzzanne Cloud, NP  sitaGLIPtin-metformin (JANUMET) 50-1000 MG tablet Take 1 tablet by mouth 2 (two) times daily with a meal. 08/18/19  Yes Nida, Marella Chimes, MD    ROS:  Out of a complete 14 system review of symptoms, the patient complains only of the following symptoms, and all other reviewed systems are negative.  Seizures, intractable Hand and knee pain Insomnia  Blood pressure (!) 150/96, pulse 91, temperature 97.6 F (36.4 C), height 5\' 7"  (1.702 m), weight 208 lb (94.3 kg).  Physical  Exam  General: The patient is alert and cooperative at the time of the examination.  Skin: No significant peripheral edema is noted.   Neurologic Exam  Mental status: The patient is alert and oriented x 3 at the time of the examination. The patient has apparent normal recent and remote memory, with an apparently normal attention span and concentration ability.   Cranial nerves: Facial symmetry is present. Speech is normal, no aphasia or dysarthria is noted. Extraocular movements are full. Visual fields are full.  Pupils are equal, round, and reactive to light.  Discs are flat bilaterally.  Motor: The patient has good strength in all 4  extremities.  Sensory examination: Soft touch sensation is symmetric on the face, arms, and legs.  Coordination: The patient has good finger-nose-finger and heel-to-shin bilaterally.  Gait and station: The patient has a normal gait. Tandem gait is normal. Romberg is negative. No drift is seen.  Reflexes: Deep tendon reflexes are symmetric.   Assessment/Plan:  1.  Multiple sclerosis  2.  Intractable seizures  3.  Chronic insomnia  The patient will be set up for blood work today, we will go up on the Keppra to 750 mg in the morning and 1500 mg in the evening, we may adjust the Depakote dosing depend upon the blood work results.  He will have a repeat MRI of the brain with and without gadolinium enhancement.  I will set him up for referral to [facility] for video EEG monitoring and evaluation for possible epilepsy surgery.  He will follow-up here in 4 months.  He will continue on Tecfidera for now.  Martin Palau MD 09/15/2019 11:04 AM  [facility] [address] [place], [place] [address]  Phone [phone] Fax [phone]

## 2019-09-17 LAB — COMPREHENSIVE METABOLIC PANEL
ALT: 41 IU/L (ref 0–44)
AST: 30 IU/L (ref 0–40)
Albumin/Globulin Ratio: 1.5 (ref 1.2–2.2)
Albumin: 4.5 g/dL (ref 4.0–5.0)
Alkaline Phosphatase: 46 IU/L (ref 39–117)
BUN/Creatinine Ratio: 10 (ref 9–20)
BUN: 9 mg/dL (ref 6–20)
Bilirubin Total: 0.3 mg/dL (ref 0.0–1.2)
CO2: 23 mmol/L (ref 20–29)
Calcium: 9.4 mg/dL (ref 8.7–10.2)
Chloride: 100 mmol/L (ref 96–106)
Creatinine, Ser: 0.94 mg/dL (ref 0.76–1.27)
GFR calc Af Amer: 121 mL/min/{1.73_m2} (ref >59)
GFR calc non Af Amer: 105 mL/min/{1.73_m2} (ref >59)
Globulin, Total: 3 g/dL (ref 1.5–4.5)
Glucose: 208 mg/dL — ABNORMAL HIGH (ref 65–99)
Potassium: 4.2 mmol/L (ref 3.5–5.2)
Sodium: 139 mmol/L (ref 134–144)
Total Protein: 7.5 g/dL (ref 6.0–8.5)

## 2019-09-17 LAB — CBC WITH DIFFERENTIAL/PLATELET
Basophils Absolute: 0 10*3/uL (ref 0.0–0.2)
Basos: 0 %
EOS (ABSOLUTE): 0.1 10*3/uL (ref 0.0–0.4)
Eos: 1 %
Hematocrit: 40.4 % (ref 37.5–51.0)
Hemoglobin: 13.2 g/dL (ref 13.0–17.7)
Immature Grans (Abs): 0.1 10*3/uL (ref 0.0–0.1)
Immature Granulocytes: 1 %
Lymphocytes Absolute: 2 10*3/uL (ref 0.7–3.1)
Lymphs: 32 %
MCH: 26.7 pg (ref 26.6–33.0)
MCHC: 32.7 g/dL (ref 31.5–35.7)
MCV: 82 fL (ref 79–97)
Monocytes Absolute: 0.4 10*3/uL (ref 0.1–0.9)
Monocytes: 6 %
Neutrophils Absolute: 3.8 10*3/uL (ref 1.4–7.0)
Neutrophils: 60 %
Platelets: 270 10*3/uL (ref 150–450)
RBC: 4.95 x10E6/uL (ref 4.14–5.80)
RDW: 13.6 % (ref 11.6–15.4)
WBC: 6.3 10*3/uL (ref 3.4–10.8)

## 2019-09-17 LAB — LEVETIRACETAM LEVEL: Levetiracetam Lvl: 22.5 ug/mL (ref 10.0–40.0)

## 2019-09-17 LAB — VALPROIC ACID LEVEL: Valproic Acid Lvl: 70 ug/mL (ref 50–100)

## 2019-09-19 ENCOUNTER — Telehealth: Payer: Self-pay

## 2019-09-19 ENCOUNTER — Telehealth: Payer: Self-pay | Admitting: Neurology

## 2019-09-19 DIAGNOSIS — Z5181 Encounter for therapeutic drug level monitoring: Secondary | ICD-10-CM

## 2019-09-19 DIAGNOSIS — R569 Unspecified convulsions: Secondary | ICD-10-CM

## 2019-09-19 DIAGNOSIS — G35 Multiple sclerosis: Secondary | ICD-10-CM

## 2019-09-19 MED ORDER — DIVALPROEX SODIUM 500 MG PO DR TAB: DELAYED_RELEASE_TABLET | ORAL | 5 refills | Status: DC

## 2019-09-19 NOTE — Telephone Encounter (Signed)
I called pt about lab work unremarkable. I stated blood glucose was high and contact diabetic md. Pt stated he had some orange juice that am. He checks blood glucose daily. I reminded him to contact diabetic md about it being 208. PT verbalized understanding.

## 2019-09-19 NOTE — Telephone Encounter (Signed)
I called the patient.  The blood work was unremarkable with exception of the blood sugar level as a random sample was elevated.  The patient does have a history of diabetes.  The Keppra and Depakote levels were in the mid therapeutic range, I have already increased the Keppra, will increase the Depakote as well taking 1000 mg in the morning and 1500 mg in the evening.  He has been referred to [facility] for video EEG monitoring.

## 2019-09-19 NOTE — Progress Notes (Signed)
Recent labs were unremarkable from 09/15/2019 with the exception of elevated blood sugar at 208.  Please update patient and advise him to discuss diabetes management with his endocrinologist.

## 2019-09-19 NOTE — Telephone Encounter (Signed)
-----   Message from Huston Foley, MD sent at 09/19/2019  4:00 PM EST ----- Recent labs were unremarkable from 09/15/2019 with the exception of elevated blood sugar at 208.  Please update patient and advise him to discuss diabetes management with his endocrinologist.

## 2019-09-28 ENCOUNTER — Other Ambulatory Visit: Payer: Self-pay

## 2019-09-28 MED ORDER — SITAGLIPTIN PHOS-METFORMIN HCL 50-1000 MG PO TABS: 1.0000 | ORAL_TABLET | Freq: Two times a day (BID) | ORAL | 1 refills | Status: DC

## 2019-10-04 ENCOUNTER — Ambulatory Visit
Admission: RE | Admit: 2019-10-04 | Discharge: 2019-10-04 | Disposition: A | Discharge status: Home / Self Care | Payer: Medicaid Other | Source: Ambulatory Visit | Attending: Neurology | Admitting: Neurology

## 2019-10-04 ENCOUNTER — Other Ambulatory Visit: Payer: Self-pay

## 2019-10-04 DIAGNOSIS — G35 Multiple sclerosis: Secondary | ICD-10-CM

## 2019-10-04 MED ORDER — GADOBENATE DIMEGLUMINE 529 MG/ML IV SOLN
19.0000 mL | Freq: Once | INTRAVENOUS | Status: AC | PRN
  Administered 2019-10-04: 19 mL via INTRAVENOUS

## 2019-10-05 ENCOUNTER — Other Ambulatory Visit: Payer: Self-pay

## 2019-10-07 ENCOUNTER — Telehealth: Payer: Self-pay | Admitting: Neurology

## 2019-10-07 NOTE — Telephone Encounter (Signed)
I called the patient.  MRI does show a new left brainstem lesion.  The patient is on Tecfidera which does not represent an extremely aggressive motor treatment.  May consider switch over to Mayzent or Gilenya or even Tysabri or Ocrevus.  The patient is a black male, chance for more aggressive MS is higher.  I will get a revisit set up for the patient.   MRI brain 10/06/19:  IMPRESSION: Abnormal MRI scan of the brain showing bilateral periatrial, subcortical as well as left cerebral peduncle white matter hyperintensities compatible with chronic demyelinating disease.  No enhancing lesions are noted.  Compared with previous MRI from 10/14/2017 the left cerebellar peduncle lesion appears new.

## 2019-11-28 ENCOUNTER — Telehealth: Payer: Self-pay | Admitting: Neurology

## 2019-11-28 ENCOUNTER — Ambulatory Visit: Payer: Self-pay | Admitting: Neurology

## 2019-11-28 NOTE — Telephone Encounter (Signed)
I called pt that he was given 11 refills in 05/2019 and its good till 09/201. Pt will call [facility] in [place].

## 2019-11-28 NOTE — Progress Notes (Deleted)
PATIENT: Martin Stevenson DOB: [birth year]  REASON FOR VISIT: follow up HISTORY FROM: patient  HISTORY OF PRESENT ILLNESS: Today 11/28/19  Martin Stevenson is a 34 year old male with history of multiple sclerosis and intractable seizures.  He is on Depakote and Keppra, continues to have seizures with some regularity, he may go 3 weeks without a seizure, but usually does not go entire month without an event.  The seizures are associated with impaired consciousness and behavioral manifestations, he has evidence of right frontotemporal abnormalities on EEG.  When last seen his Keppra and Depakote level was increased.  He was referred to [facility] video monitoring EEG.  For MS, he is on Tecfidera.  MRI of the brain in February showed a new left brainstem lesion.  Need to consider switch over to more aggressive medication.  HISTORY 09/15/2019 Dr. Jannifer Franklin: Martin Stevenson is a 34 year old right-handed black male with a history of multiple sclerosis and intractable seizures.  The patient is on Depakote and Keppra, he continues to have seizures with some regularity, he had 2 seizures yesterday, he may go up to 3 weeks without any seizures but usually does not go an entire month without an event.  The seizures are associated with impaired consciousness and behavioral manifestations, the patient has had evidence of right frontotemporal abnormalities on EEG study.  The patient claims that he has chronic problems with insomnia.  He is on Tecfidera for his multiple sclerosis, he tolerates this well.  He will have occasional sharp pains in the hands and in the left knee that comes and goes.  The patient reports no new vision changes, numbness or weakness of the extremities.  The last MRI of the brain was done in 2019.  He returns for an evaluation.  He has diabetes, his hemoglobin A1c is 7.5.  He does have a family history of seizures.  REVIEW OF SYSTEMS: Out of a complete 14 system review of symptoms, the patient complains only  of the following symptoms, and all other reviewed systems are negative.  ALLERGIES: Allergies  Allergen Reactions  . Benazepril     States that it made his mouth burn  . Shellfish Allergy     HOME MEDICATIONS: Outpatient Medications Prior to Visit  Medication Sig Dispense Refill  . Cholecalciferol (VITAMIN D3) 125 MCG (5000 UT) CAPS Take 1 capsule (5,000 Units total) by mouth daily. 90 capsule 0  . Dimethyl Fumarate (TECFIDERA) 240 MG CPDR Take 1 capsule (240 mg total) by mouth 2 (two) times daily. 60 capsule 11  . divalproex (DEPAKOTE) 500 MG DR tablet 2 tablets in the morning, 3 in the evening 150 tablet 5  . glipiZIDE (GLUCOTROL XL) 5 MG 24 hr tablet Take 1 tablet (5 mg total) by mouth daily with breakfast. 30 tablet 3  . hydrochlorothiazide (HYDRODIURIL) 25 MG tablet Take 1 tablet (25 mg total) by mouth daily. 90 tablet 1  . levETIRAcetam (KEPPRA) 750 MG tablet One tablet in the morning and 2 in the evening 270 tablet 1  . sitaGLIPtin-metformin (JANUMET) 50-1000 MG tablet Take 1 tablet by mouth 2 (two) times daily with a meal. 60 tablet 1   No facility-administered medications prior to visit.    PAST MEDICAL HISTORY: Past Medical History:  Diagnosis Date  . Diabetes ([place])   . Fluid retention   . Hypertension   . Multiple sclerosis ([place]) 12/29/2017  . Seizures ([place])     PAST SURGICAL HISTORY: Past Surgical History:  Procedure Laterality Date  . MOUTH SURGERY  x3, jaw, hip, chin    FAMILY HISTORY: Family History  Problem Relation Age of Onset  . Diabetes Mother   . Pulmonary embolism Mother        hx of blood clots  . Diabetes Father     SOCIAL HISTORY: Social History   Socioeconomic History  . Marital status: Single    Spouse name: Not on file  . Number of children: 3  . Years of education: HS  . Highest education level: Not on file  Occupational History    Comment: Unemployed  Tobacco Use  . Smoking status: Never Smoker  . Smokeless tobacco: Never  Used  Substance and Sexual Activity  . Alcohol use: No    Comment: Quit: 2012  . Drug use: No  . Sexual activity: Not on file  Other Topics Concern  . Not on file  Social History Narrative   Pt lives at home with his spouse.   Caffeine Use: very little   Right handed    Social Determinants of Health   Financial Resource Strain:   . Difficulty of Paying Living Expenses:   Food Insecurity:   . Worried About Programme researcher, broadcasting/film/video in the Last Year:   . Barista in the Last Year:   Transportation Needs:   . Freight forwarder (Medical):   Marland Kitchen Lack of Transportation (Non-Medical):   Physical Activity:   . Days of Exercise per Week:   . Minutes of Exercise per Session:   Stress:   . Feeling of Stress :   Social Connections:   . Frequency of Communication with Friends and Family:   . Frequency of Social Gatherings with Friends and Family:   . Attends Religious Services:   . Active Member of Clubs or Organizations:   . Attends Banker Meetings:   Marland Kitchen Marital Status:   Intimate Partner Violence:   . Fear of Current or Ex-Partner:   . Emotionally Abused:   Marland Kitchen Physically Abused:   . Sexually Abused:       PHYSICAL EXAM  There were no vitals filed for this visit. There is no height or weight on file to calculate BMI.  Generalized: Well developed, in no acute distress   Neurological examination  Mentation: Alert oriented to time, place, history taking. Follows all commands speech and language fluent Cranial nerve II-XII: Pupils were equal round reactive to light. Extraocular movements were full, visual field were full on confrontational test. Facial sensation and strength were normal. Uvula tongue midline. Head turning and shoulder shrug  were normal and symmetric. Motor: The motor testing reveals 5 over 5 strength of all 4 extremities. Good symmetric motor tone is noted throughout.  Sensory: Sensory testing is intact to soft touch on all 4 extremities. No  evidence of extinction is noted.  Coordination: Cerebellar testing reveals good finger-nose-finger and heel-to-shin bilaterally.  Gait and station: Gait is normal. Tandem gait is normal. Romberg is negative. No drift is seen.  Reflexes: Deep tendon reflexes are symmetric and normal bilaterally.   DIAGNOSTIC DATA (LABS, IMAGING, TESTING) - I reviewed patient records, labs, notes, testing and imaging myself where available.  Lab Results  Component Value Date   WBC 6.3 09/15/2019   HGB 13.2 09/15/2019   HCT 40.4 09/15/2019   MCV 82 09/15/2019   PLT 270 09/15/2019      Component Value Date/Time   NA 139 09/15/2019 1123   K 4.2 09/15/2019 1123   CL 100 09/15/2019 1123  CO2 23 09/15/2019 1123   GLUCOSE 208 (H) 09/15/2019 1123   GLUCOSE 126 (H) 07/04/2012 1555   BUN 9 09/15/2019 1123   CREATININE 0.94 09/15/2019 1123   CALCIUM 9.4 09/15/2019 1123   PROT 7.5 09/15/2019 1123   ALBUMIN 4.5 09/15/2019 1123   AST 30 09/15/2019 1123   ALT 41 09/15/2019 1123   ALKPHOS 46 09/15/2019 1123   BILITOT 0.3 09/15/2019 1123   GFRNONAA 105 09/15/2019 1123   GFRAA 121 09/15/2019 1123   Lab Results  Component Value Date   CHOL 201 (A) 04/06/2019   HDL 75 (A) 04/06/2019   LDLCALC 108 04/06/2019   TRIG 93 04/06/2019   Lab Results  Component Value Date   HGBA1C 7.5 (A) 08/22/2019   No results found for: VITAMINB12 Lab Results  Component Value Date   TSH 1.820 09/21/2018      ASSESSMENT AND PLAN 34 y.o. year old male  has a past medical history of Diabetes (HCC), Fluid retention, Hypertension, Multiple sclerosis (HCC) (12/29/2017), and Seizures (HCC). here with ***   I spent 15 minutes with the patient. 50% of this time was spent   Margie Ege, [place], DNP 11/28/2019, 5:43 AM [facility] [address] [place], [place] [address] [phone]

## 2019-11-28 NOTE — Telephone Encounter (Signed)
Pt called needing a refill on his Dimethyl Fumarate (TECFIDERA) 240 MG CPDR sent in to the [facility] in [place]

## 2019-11-28 NOTE — Telephone Encounter (Addendum)
I called [facility] [place]. I stated pt was given 11 refills in 05/2019 and is requesting another refill. The pharmacy tech tech stated pt has refills on file till  04/2020. I stated pt will be call.

## 2019-11-29 ENCOUNTER — Ambulatory Visit: Payer: Self-pay | Admitting: Neurology

## 2019-11-29 NOTE — Progress Notes (Deleted)
PATIENT: Martin Stevenson DOB: 14-May-1986  REASON FOR VISIT: follow up HISTORY FROM: patient  HISTORY OF PRESENT ILLNESS: Today 11/29/19  Martin Stevenson with history of multiple sclerosis and intractable seizures.  He remains on Depakote and Keppra for seizures, doses were recently increased in February, he was referred to [facility] for video monitoring EEG.  Purpose of today's visit is to consider medication change for multiple sclerosis.  He remains on Tecfidera.  Recent MRI of the brain showed a new left brainstem lesion.  Need to consider aggressive medication therapy.  HISTORY 09/15/2019 Dr. Jannifer Franklin: Martin Stevenson with a history of multiple sclerosis and intractable seizures.  The patient is on Depakote and Keppra, he continues to have seizures with some regularity, he had 2 seizures yesterday, he may go up to 3 weeks without any seizures but usually does not go an entire month without an event.  The seizures are associated with impaired consciousness and behavioral manifestations, the patient has had evidence of right frontotemporal abnormalities on EEG study.  The patient claims that he has chronic problems with insomnia.  He is on Tecfidera for his multiple sclerosis, he tolerates this well.  He will have occasional sharp pains in the hands and in the left knee that comes and goes.  The patient reports no new vision changes, numbness or weakness of the extremities.  The last MRI of the brain was done in 2019.  He returns for an evaluation.  He has diabetes, his hemoglobin A1c is 7.5.  He does have a family history of seizures.   REVIEW OF SYSTEMS: Out of a complete 14 system review of symptoms, the patient complains only of the following symptoms, and all other reviewed systems are negative.  ALLERGIES: Allergies  Allergen Reactions  . Benazepril     States that it made his mouth burn  . Shellfish Allergy     HOME  MEDICATIONS: Outpatient Medications Prior to Visit  Medication Sig Dispense Refill  . Cholecalciferol (VITAMIN D3) 125 MCG (5000 UT) CAPS Take 1 capsule (5,000 Units total) by mouth daily. 90 capsule 0  . Dimethyl Fumarate (TECFIDERA) 240 MG CPDR Take 1 capsule (240 mg total) by mouth 2 (two) times daily. 60 capsule 11  . divalproex (DEPAKOTE) 500 MG DR tablet 2 tablets in the morning, 3 in the evening 150 tablet 5  . glipiZIDE (GLUCOTROL XL) 5 MG 24 hr tablet Take 1 tablet (5 mg total) by mouth daily with breakfast. 30 tablet 3  . hydrochlorothiazide (HYDRODIURIL) 25 MG tablet Take 1 tablet (25 mg total) by mouth daily. 90 tablet 1  . levETIRAcetam (KEPPRA) 750 MG tablet One tablet in the morning and 2 in the evening 270 tablet 1  . sitaGLIPtin-metformin (JANUMET) 50-1000 MG tablet Take 1 tablet by mouth 2 (two) times daily with a meal. 60 tablet 1   No facility-administered medications prior to visit.    PAST MEDICAL HISTORY: Past Medical History:  Diagnosis Date  . Diabetes ([place])   . Fluid retention   . Hypertension   . Multiple sclerosis ([place]) 12/29/2017  . Seizures ([place])     PAST SURGICAL HISTORY: Past Surgical History:  Procedure Laterality Date  . MOUTH SURGERY     x3, jaw, hip, chin    FAMILY HISTORY: Family History  Problem Relation Age of Onset  . Diabetes Mother   . Pulmonary embolism Mother        hx of  blood clots  . Diabetes Father     SOCIAL HISTORY: Social History   Socioeconomic History  . Marital status: Single    Spouse name: Not on file  . Number of children: 3  . Years of education: HS  . Highest education level: Not on file  Occupational History    Comment: Unemployed  Tobacco Use  . Smoking status: Never Smoker  . Smokeless tobacco: Never Used  Substance and Sexual Activity  . Alcohol use: No    Comment: Quit: 2012  . Drug use: No  . Sexual activity: Not on file  Other Topics Concern  . Not on file  Social History Narrative   Pt  lives at home with his spouse.   Caffeine Use: very little   Right handed    Social Determinants of Health   Financial Resource Strain:   . Difficulty of Paying Living Expenses:   Food Insecurity:   . Worried About Programme researcher, broadcasting/film/video in the Last Year:   . Barista in the Last Year:   Transportation Needs:   . Freight forwarder (Medical):   Marland Kitchen Lack of Transportation (Non-Medical):   Physical Activity:   . Days of Exercise per Week:   . Minutes of Exercise per Session:   Stress:   . Feeling of Stress :   Social Connections:   . Frequency of Communication with Friends and Family:   . Frequency of Social Gatherings with Friends and Family:   . Attends Religious Services:   . Active Member of Clubs or Organizations:   . Attends Banker Meetings:   Marland Kitchen Marital Status:   Intimate Partner Violence:   . Fear of Current or Ex-Partner:   . Emotionally Abused:   Marland Kitchen Physically Abused:   . Sexually Abused:       PHYSICAL EXAM  There were no vitals filed for this visit. There is no height or weight on file to calculate BMI.  Generalized: Well developed, in no acute distress   Neurological examination  Mentation: Alert oriented to time, place, history taking. Follows all commands speech and language fluent Cranial nerve II-XII: Pupils were equal round reactive to light. Extraocular movements were full, visual field were full on confrontational test. Facial sensation and strength were normal. Uvula tongue midline. Head turning and shoulder shrug  were normal and symmetric. Motor: The motor testing reveals 5 over 5 strength of all 4 extremities. Good symmetric motor tone is noted throughout.  Sensory: Sensory testing is intact to soft touch on all 4 extremities. No evidence of extinction is noted.  Coordination: Cerebellar testing reveals good finger-nose-finger and heel-to-shin bilaterally.  Gait and station: Gait is normal. Tandem gait is normal. Romberg is  negative. No drift is seen.  Reflexes: Deep tendon reflexes are symmetric and normal bilaterally.   DIAGNOSTIC DATA (LABS, IMAGING, TESTING) - I reviewed patient records, labs, notes, testing and imaging myself where available.  Lab Results  Component Value Date   WBC 6.3 09/15/2019   HGB 13.2 09/15/2019   HCT 40.4 09/15/2019   MCV 82 09/15/2019   PLT 270 09/15/2019      Component Value Date/Time   NA 139 09/15/2019 1123   K 4.2 09/15/2019 1123   CL 100 09/15/2019 1123   CO2 23 09/15/2019 1123   GLUCOSE 208 (H) 09/15/2019 1123   GLUCOSE 126 (H) 07/04/2012 1555   BUN 9 09/15/2019 1123   CREATININE 0.94 09/15/2019 1123   CALCIUM 9.4  09/15/2019 1123   PROT 7.5 09/15/2019 1123   ALBUMIN 4.5 09/15/2019 1123   AST 30 09/15/2019 1123   ALT 41 09/15/2019 1123   ALKPHOS 46 09/15/2019 1123   BILITOT 0.3 09/15/2019 1123   GFRNONAA 105 09/15/2019 1123   GFRAA 121 09/15/2019 1123   Lab Results  Component Value Date   CHOL 201 (A) 04/06/2019   HDL 75 (A) 04/06/2019   LDLCALC 108 04/06/2019   TRIG 93 04/06/2019   Lab Results  Component Value Date   HGBA1C 7.5 (A) 08/22/2019   No results found for: VITAMINB12 Lab Results  Component Value Date   TSH 1.820 09/21/2018      ASSESSMENT AND PLAN 34 y.o. year old Stevenson  has a past medical history of Diabetes (HCC), Fluid retention, Hypertension, Multiple sclerosis ([facility]) (12/29/2017), and Seizures ([facility]CC). here with ***   I spent 15 minutes with the patient. 50% of this time was spent   Margie Ege, [place], DNP 11/29/2019, 11:40 AM [facility] [address] [place], [place] [address] [phone]

## 2019-11-30 ENCOUNTER — Ambulatory Visit: Payer: Self-pay | Admitting: Neurology

## 2019-11-30 ENCOUNTER — Encounter: Payer: Self-pay | Admitting: Neurology

## 2019-12-07 ENCOUNTER — Encounter: Payer: Self-pay | Admitting: Neurology

## 2019-12-21 ENCOUNTER — Ambulatory Visit: Payer: Medicaid Other | Admitting: "Endocrinology

## 2019-12-22 LAB — COMPLETE METABOLIC PANEL WITH GFR
AG Ratio: 1.5 (calc) (ref 1.0–2.5)
ALT: 20 U/L (ref 9–46)
AST: 19 U/L (ref 10–40)
Albumin: 4.5 g/dL (ref 3.6–5.1)
Alkaline phosphatase (APISO): 45 U/L (ref 36–130)
BUN: 12 mg/dL (ref 7–25)
CO2: 27 mmol/L (ref 20–32)
Calcium: 9.8 mg/dL (ref 8.6–10.3)
Chloride: 101 mmol/L (ref 98–110)
Creat: 0.9 mg/dL (ref 0.60–1.35)
GFR, Est African American: 128 mL/min/{1.73_m2} (ref >=60)
GFR, Est Non African American: 110 mL/min/{1.73_m2} (ref >=60)
Globulin: 3.1 g/dL (calc) (ref 1.9–3.7)
Glucose, Bld: 210 mg/dL — ABNORMAL HIGH (ref 65–99)
Potassium: 4.1 mmol/L (ref 3.5–5.3)
Sodium: 138 mmol/L (ref 135–146)
Total Bilirubin: 0.5 mg/dL (ref 0.2–1.2)
Total Protein: 7.6 g/dL (ref 6.1–8.1)

## 2019-12-22 LAB — MICROALBUMIN / CREATININE URINE RATIO
Creatinine, Urine: 296 mg/dL (ref 20–320)
Microalb Creat Ratio: 3 mcg/mg creat (ref <30)
Microalb, Ur: 1 mg/dL

## 2019-12-22 LAB — LIPID PANEL
Cholesterol: 232 mg/dL — ABNORMAL HIGH (ref <200)
HDL: 63 mg/dL (ref >=40)
LDL Cholesterol (Calc): 143 mg/dL (calc) — ABNORMAL HIGH
Non-HDL Cholesterol (Calc): 169 mg/dL (calc) — ABNORMAL HIGH (ref <130)
Total CHOL/HDL Ratio: 3.7 (calc) (ref <5.0)
Triglycerides: 135 mg/dL (ref <150)

## 2019-12-22 LAB — VITAMIN D 25 HYDROXY (VIT D DEFICIENCY, FRACTURES): Vit D, 25-Hydroxy: 10 ng/mL — ABNORMAL LOW (ref 30–100)

## 2019-12-22 LAB — TSH: TSH: 1.74 mIU/L (ref 0.40–4.50)

## 2019-12-22 LAB — T4, FREE: Free T4: 1.4 ng/dL (ref 0.8–1.8)

## 2019-12-28 ENCOUNTER — Other Ambulatory Visit: Payer: Self-pay

## 2019-12-28 ENCOUNTER — Ambulatory Visit (INDEPENDENT_AMBULATORY_CARE_PROVIDER_SITE_OTHER): Payer: Medicaid Other | Admitting: "Endocrinology

## 2019-12-28 ENCOUNTER — Encounter: Payer: Self-pay | Admitting: "Endocrinology

## 2019-12-28 VITALS — BP 127/86 | HR 88 | Ht 67.0 in | Wt 208.2 lb

## 2019-12-28 DIAGNOSIS — I10 Essential (primary) hypertension: Secondary | ICD-10-CM | POA: Diagnosis not present

## 2019-12-28 DIAGNOSIS — E559 Vitamin D deficiency, unspecified: Secondary | ICD-10-CM | POA: Diagnosis not present

## 2019-12-28 DIAGNOSIS — E1165 Type 2 diabetes mellitus with hyperglycemia: Secondary | ICD-10-CM

## 2019-12-28 DIAGNOSIS — E782 Mixed hyperlipidemia: Secondary | ICD-10-CM

## 2019-12-28 LAB — POCT GLYCOSYLATED HEMOGLOBIN (HGB A1C): Hemoglobin A1C: 9.3 % — AB (ref 4.0–5.6)

## 2019-12-28 MED ORDER — SITAGLIPTIN PHOS-METFORMIN HCL 50-1000 MG PO TABS: 1.0000 | ORAL_TABLET | Freq: Two times a day (BID) | ORAL | 2 refills | Status: DC

## 2019-12-28 MED ORDER — ATORVASTATIN CALCIUM 20 MG PO TABS: 20.0000 mg | ORAL_TABLET | Freq: Every day | ORAL | 3 refills | Status: DC

## 2019-12-28 MED ORDER — VITAMIN D (ERGOCALCIFEROL) 1.25 MG (50000 UNIT) PO CAPS: 50000.0000 [IU] | ORAL_CAPSULE | ORAL | 0 refills | Status: DC

## 2019-12-28 MED ORDER — GLIPIZIDE ER 5 MG PO TB24: 10.0000 mg | ORAL_TABLET | Freq: Every day | ORAL | 3 refills | Status: DC

## 2019-12-28 NOTE — Progress Notes (Signed)
12/28/2019, 8:49 AM   Endocrinology follow-up note   Subjective:    Patient ID: Martin Stevenson, male    DOB: [birth year].  Martin Stevenson is being seen in follow-up  for management of currently uncontrolled symptomatic diabetes requested by  Avon Gully, MD.   Past Medical History:  Diagnosis Date  . Diabetes (HCC)   . Fluid retention   . Hypertension   . Multiple sclerosis (HCC) 12/29/2017  . Seizures (HCC)    Past Surgical History:  Procedure Laterality Date  . MOUTH SURGERY     x3, jaw, hip, chin   Social History   Socioeconomic History  . Marital status: Single    Spouse name: Not on file  . Number of children: 3  . Years of education: HS  . Highest education level: Not on file  Occupational History    Comment: Unemployed  Tobacco Use  . Smoking status: Never Smoker  . Smokeless tobacco: Never Used  Substance and Sexual Activity  . Alcohol use: No    Comment: Quit: 2012  . Drug use: No  . Sexual activity: Not on file  Other Topics Concern  . Not on file  Social History Narrative   Pt lives at home with his spouse.   Caffeine Use: very little   Right handed    Social Determinants of Health   Financial Resource Strain:   . Difficulty of Paying Living Expenses:   Food Insecurity:   . Worried About Programme researcher, broadcasting/film/video in the Last Year:   . Barista in the Last Year:   Transportation Needs:   . Freight forwarder (Medical):   Marland Kitchen Lack of Transportation (Non-Medical):   Physical Activity:   . Days of Exercise per Week:   . Minutes of Exercise per Session:   Stress:   . Feeling of Stress :   Social Connections:   . Frequency of Communication with Friends and Family:   . Frequency of Social Gatherings with Friends and Family:   . Attends Religious Services:   . Active Member of Clubs or Organizations:   . Attends Banker Meetings:   Marland Kitchen Marital Status:     Outpatient Encounter Medications as of 12/28/2019  Medication Sig  . atorvastatin (LIPITOR) 20 MG tablet Take 1 tablet (20 mg total) by mouth daily.  . Cholecalciferol (VITAMIN D3) 125 MCG (5000 UT) CAPS Take 1 capsule (5,000 Units total) by mouth daily.  . Dimethyl Fumarate (TECFIDERA) 240 MG CPDR Take 1 capsule (240 mg total) by mouth 2 (two) times daily.  . divalproex (DEPAKOTE) 500 MG DR tablet 2 tablets in the morning, 3 in the evening  . glipiZIDE (GLUCOTROL XL) 5 MG 24 hr tablet Take 2 tablets (10 mg total) by mouth daily with breakfast.  . hydrochlorothiazide (HYDRODIURIL) 25 MG tablet Take 1 tablet (25 mg total) by mouth daily.  Marland Kitchen levETIRAcetam (KEPPRA) 750 MG tablet One tablet in the morning and 2 in the evening  . sitaGLIPtin-metformin (JANUMET) 50-1000 MG tablet Take 1 tablet by mouth 2 (two) times daily with a meal.  . Vitamin D, Ergocalciferol, (DRISDOL) 1.25 MG (50000 UNIT) CAPS  capsule Take 1 capsule (50,000 Units total) by mouth every 7 (seven) days.  . [DISCONTINUED] glipiZIDE (GLUCOTROL XL) 5 MG 24 hr tablet Take 1 tablet (5 mg total) by mouth daily with breakfast.  . [DISCONTINUED] sitaGLIPtin-metformin (JANUMET) 50-1000 MG tablet Take 1 tablet by mouth 2 (two) times daily with a meal.   No facility-administered encounter medications on file as of 12/28/2019.    ALLERGIES: Allergies  Allergen Reactions  . Benazepril     States that it made his mouth burn  . Shellfish Allergy     VACCINATION STATUS:  There is no immunization history on file for this patient.  Diabetes He presents for his follow-up diabetic visit. He has type 2 diabetes mellitus. Onset time: He was diagnosed at approximate age of 34 years. His disease course has been worsening. There are no hypoglycemic associated symptoms. Pertinent negatives for hypoglycemia include no confusion, headaches, pallor or seizures. Associated symptoms include polydipsia and polyuria. Pertinent negatives for diabetes  include no chest pain, no fatigue, no polyphagia and no weakness. There are no hypoglycemic complications. Symptoms are worsening. There are no diabetic complications. Risk factors for coronary artery disease include diabetes mellitus, male sex, obesity and sedentary lifestyle. Current diabetic treatments: He is on Janumet 50/1000 mg p.o. twice daily.  His weight is fluctuating minimally. He is following a generally unhealthy diet. When asked about meal planning, he reported none. He has not had a previous visit with a dietitian. He rarely participates in exercise. An ACE inhibitor/angiotensin II receptor blocker is not being taken. He does not see a podiatrist.Eye exam is not current.  Hyperlipidemia This is a chronic problem. The current episode started more than 1 year ago. The problem is uncontrolled. Pertinent negatives include no chest pain. He is currently on no antihyperlipidemic treatment. Risk factors for coronary artery disease include diabetes mellitus, dyslipidemia, male sex, obesity, a sedentary lifestyle and family history.    Review of systems  Constitutional: + Minimally fluctuating body weight,  current  Body mass index is 32.61 kg/m. , no fatigue, no subjective hyperthermia, no subjective hypothermia Eyes: no blurry vision, no xerophthalmia ENT: no sore throat, no nodules palpated in throat, no dysphagia/odynophagia, no hoarseness Cardiovascular: no Chest Pain, no Shortness of Breath, no palpitations, no leg swelling Respiratory: no cough, no shortness of breath Gastrointestinal: no Nausea/Vomiting/Diarhhea Musculoskeletal: no muscle/joint aches Skin: no rashes, no hyperemia Neurological: no tremors, no numbness, no tingling, no dizziness Psychiatric: no depression, no anxiety    Objective:    BP 127/86   Pulse 88   Ht 5\' 7"  (1.702 m)   Wt 208 lb 3.2 oz (94.4 kg)   BMI 32.61 kg/m   Wt Readings from Last 3 Encounters:  12/28/19 208 lb 3.2 oz (94.4 kg)  09/15/19 208 lb  (94.3 kg)  08/22/19 207 lb 6.4 oz (94.1 kg)      Physical Exam- Limited  Constitutional:  Body mass index is 32.61 kg/m. , not in acute distress, normal state of mind Eyes:  EOMI, no exophthalmos Neck: Supple Thyroid: No gross goiter Respiratory: Adequate breathing efforts Musculoskeletal: no gross deformities, strength intact in all four extremities, no gross restriction of joint movements Skin:  no rashes, no hyperemia Neurological: no tremor with outstretched hands,     Recent Results (from the past 2160 hour(s))  COMPLETE METABOLIC PANEL WITH GFR     Status: Abnormal   Collection Time: 12/21/19  8:30 AM  Result Value Ref Range   Glucose, Bld 210 (H)  65 - 99 mg/dL    Comment: .            Fasting reference interval . For someone without known diabetes, a glucose value >125 mg/dL indicates that they may have diabetes and this should be confirmed with a follow-up test. .    BUN 12 7 - 25 mg/dL   Creat 0.90 0.60 - 1.35 mg/dL   GFR, Est Non African American 110 > OR = 60 mL/min/1.26m2   GFR, Est African American 128 > OR = 60 mL/min/1.79m2   BUN/Creatinine Ratio NOT APPLICABLE 6 - 22 (calc)   Sodium 138 135 - 146 mmol/L   Potassium 4.1 3.5 - 5.3 mmol/L   Chloride 101 98 - 110 mmol/L   CO2 27 20 - 32 mmol/L   Calcium 9.8 8.6 - 10.3 mg/dL   Total Protein 7.6 6.1 - 8.1 g/dL   Albumin 4.5 3.6 - 5.1 g/dL   Globulin 3.1 1.9 - 3.7 g/dL (calc)   AG Ratio 1.5 1.0 - 2.5 (calc)   Total Bilirubin 0.5 0.2 - 1.2 mg/dL   Alkaline phosphatase (APISO) 45 36 - 130 U/L   AST 19 10 - 40 U/L   ALT 20 9 - 46 U/L  Microalbumin/Creatinine Ratio, Urine     Status: None   Collection Time: 12/21/19  8:30 AM  Result Value Ref Range   Creatinine, Urine 296 20 - 320 mg/dL   Microalb, Ur 1.0 mg/dL    Comment: Reference Range Not established    Microalb Creat Ratio 3 <30 mcg/mg creat    Comment: . The ADA defines abnormalities in albumin excretion as follows: Marland Kitchen Category         Result  (mcg/mg creatinine) . Normal                    <30 Microalbuminuria         30-299  Clinical albuminuria   > OR = 300 . The ADA recommends that at least two of three specimens collected within a 3-6 month period be abnormal before considering a patient to be within a diagnostic category.   Lipid Panel     Status: Abnormal   Collection Time: 12/21/19  8:30 AM  Result Value Ref Range   Cholesterol 232 (H) <200 mg/dL   HDL 63 > OR = 40 mg/dL   Triglycerides 135 <150 mg/dL   LDL Cholesterol (Calc) 143 (H) mg/dL (calc)    Comment: Reference range: <100 . Desirable range <100 mg/dL for primary prevention;   <70 mg/dL for patients with CHD or diabetic patients  with > or = 2 CHD risk factors. Marland Kitchen LDL-C is now calculated using the Martin-Hopkins  calculation, which is a validated novel method providing  better accuracy than the Friedewald equation in the  estimation of LDL-C.  Cresenciano Genre et al. Annamaria Helling. 0347;425(95): 2061-2068  (http://education.[facility].com/faq/FAQ164)    Total CHOL/HDL Ratio 3.7 <5.0 (calc)   Non-HDL Cholesterol (Calc) 169 (H) <130 mg/dL (calc)    Comment: For patients with diabetes plus 1 major ASCVD risk  factor, treating to a non-HDL-C goal of <100 mg/dL  (LDL-C of <70 mg/dL) is considered a therapeutic  option.   TSH SOLSTAS     Status: None   Collection Time: 12/21/19  8:30 AM  Result Value Ref Range   TSH 1.74 0.40 - 4.50 mIU/L  T4, free SOLSTAS     Status: None   Collection Time: 12/21/19  8:30 AM  Result Value  Ref Range   Free T4 1.4 0.8 - 1.8 ng/dL  VITAMIN D 25 Hydroxy (Vit-D Deficiency, Fractures)     Status: Abnormal   Collection Time: 12/21/19  8:30 AM  Result Value Ref Range   Vit D, 25-Hydroxy 10 (L) 30 - 100 ng/mL    Comment: Vitamin D Status         25-OH Vitamin D: . Deficiency:                    <20 ng/mL Insufficiency:             20 - 29 ng/mL Optimal:                 > or = 30 ng/mL . For 25-OH Vitamin D testing on patients  on  D2-supplementation and patients for whom quantitation  of D2 and D3 fractions is required, the [facility]ure[facility](TM) 25-OH VIT D, (D2,D3), LC/MS/MS is recommended: order  code 51884 (patients >26yrs). See Note 1 . Note 1 . For additional information, please refer to  http://education.[facility].com/faq/FAQ199  (This link is being provided for informational/ educational purposes only.)     Assessment & Plan:   1. Uncontrolled type 2 diabetes mellitus with hyperglycemia (HCC)  - Myrle Ines has currently uncontrolled symptomatic type 2 DM since 34 years of age. - His previsit labs were discussed with him.  His point-of-care A1c is 9.3% increasing from 7.5%.    -He does not report any gross complications from his diabetes, however patient with multiple sclerosis as well as seizure disorders,  and he remains at a high risk for more acute and chronic complications which include CAD, CVA, CKD, retinopathy, and neuropathy. These are all discussed in detail with him.  - I have counseled him on diet management and weight loss, by adopting a carbohydrate restricted/protein rich diet.  - he  admits there is a room for improvement in his diet and drink choices. -  Suggestion is made for him to avoid simple carbohydrates  from his diet including Cakes, Sweet Desserts / Pastries, Ice Cream, Soda (diet and regular), Sweet Tea, Candies, Chips, Cookies, Sweet Pastries,  Store Bought Juices, Alcohol in Excess of  1-2 drinks a day, Artificial Sweeteners, Coffee Creamer, and "Sugar-free" Products. This will help patient to have stable blood glucose profile and potentially avoid unintended weight gain.   - I encouraged him to switch to  unprocessed or minimally processed complex starch and increased protein intake (animal or plant source), fruits, and vegetables.  - he is advised to stick to a routine mealtimes to eat 3 meals  a day and avoid unnecessary snacks ( to snack only to correct  hypoglycemia).    -He is Janumet was interrupted for more than 2 weeks because he missed his appointment for labs.  Based on his point-of-care A1c of 9.3% which is higher than his last visit A1c of 12.5% he was approached for basal insulin, however patient declined this option for now.   -In preparation, he is approached to start monitoring blood glucose 4 times a day-before meals and at bedtime and return in 2 weeks with his meter and logs.    -In the meantime, he is advised to resume his Janumet 50/1000 mg p.o. twice daily, therapeutically suitable for the patient. -I discussed and increase his glipizide to 10 mg p.o. daily at breakfast.    - Patient specific target  A1c;  LDL, HDL, Triglycerides,  were discussed in detail.  2) BP/HTN:  He is allergic to ACE inhibitors.  His blood pressure is controlled today, advised to continue hydrochlorothiazide 25 mg p.o. daily at breakfast.   3) Lipids/HPL: His previsit labs show increased LDL at 143 from 100.  He is approached with a statin prescription and he accepts.  I discussed and prescribed atorvastatin 20 mg p.o. nightly.  Side effects and precautions discussed with him.     4)  Weight/Diet:  Body mass index is 32.61 kg/m.  - clearly complicating his diabetes care.  I discussed with him the fact that loss of 5 - 10% of his  current body weight will have the most impact on his diabetes management.  CDE Consult will be initiated . Exercise, and detailed carbohydrates information provided  -  detailed on discharge instructions.  5) vitamin D deficiency: He did  pick up the OTC vitamin D3.  I discussed and added vitamin D2 50,000 units weekly for 12 weeks.   5) Chronic Care/Health Maintenance:  -he   is encouraged to initiate and continue to follow up with Ophthalmology, Dentist,  Podiatrist at least yearly or according to recommendations, and advised to  stay away from smoking. I have recommended yearly flu vaccine and pneumonia vaccine at  least every 5 years; moderate intensity exercise for up to 150 minutes weekly; and  sleep for at least 7 hours a day.  - I advised patient to maintain close follow up with Avon Gully, MD for primary care needs.  - Time spent on this patient care encounter:  35 min, of which > 50% was spent in  counseling and the rest reviewing his blood glucose logs , discussing his hypoglycemia and hyperglycemia episodes, reviewing his current and  previous labs / studies  ( including abstraction from other facilities) and medications  doses and developing a  long term treatment plan and documenting his care.   Please refer to Patient Instructions for Blood Glucose Monitoring and Insulin/Medications Dosing Guide"  in media tab for additional information. Please  also refer to " Patient Self Inventory" in the Media  tab for reviewed elements of pertinent patient history.  Braydon Dunnam participated in the discussions, expressed understanding, and voiced agreement with the above plans.  All questions were answered to his satisfaction. he is encouraged to contact clinic should he have any questions or concerns prior to his return visit.   Follow up plan: - Return in about 2 weeks (around 01/11/2020) for Bring Meter and Logs- A1c in Office.  Marquis Lunch, MD [facility] [address] [place], [place] [address] Phone: [phone]  Fax: [phone]    12/28/2019, 8:49 AM  This note was partially dictated with voice recognition software. Similar sounding words can be transcribed inadequately or may not  be corrected upon review.

## 2019-12-28 NOTE — Patient Instructions (Signed)

## 2020-01-03 ENCOUNTER — Other Ambulatory Visit: Payer: Self-pay | Admitting: "Endocrinology

## 2020-01-03 ENCOUNTER — Telehealth: Payer: Self-pay | Admitting: "Endocrinology

## 2020-01-03 MED ORDER — METFORMIN HCL 1000 MG PO TABS: 1000.0000 mg | ORAL_TABLET | Freq: Two times a day (BID) | ORAL | 3 refills | Status: DC

## 2020-01-03 NOTE — Telephone Encounter (Signed)
I will send rx for metformin 1000mg  po BID.

## 2020-01-03 NOTE — Telephone Encounter (Signed)
[facility] left a VM that sitaGLIPtin-metformin (JANUMET) 50-1000 MG tablet is still not being approved by insurance. He is asking for something in place of it.

## 2020-01-05 ENCOUNTER — Ambulatory Visit: Payer: Medicaid Other | Admitting: Neurology

## 2020-01-05 ENCOUNTER — Encounter: Payer: Self-pay | Admitting: Neurology

## 2020-01-05 ENCOUNTER — Other Ambulatory Visit: Payer: Self-pay

## 2020-01-05 ENCOUNTER — Telehealth: Payer: Self-pay

## 2020-01-05 DIAGNOSIS — G35 Multiple sclerosis: Secondary | ICD-10-CM

## 2020-01-05 DIAGNOSIS — R569 Unspecified convulsions: Secondary | ICD-10-CM | POA: Diagnosis not present

## 2020-01-05 DIAGNOSIS — Z5181 Encounter for therapeutic drug level monitoring: Secondary | ICD-10-CM

## 2020-01-05 MED ORDER — LEVETIRACETAM 750 MG PO TABS: ORAL_TABLET | ORAL | 1 refills | Status: DC

## 2020-01-05 MED ORDER — DIVALPROEX SODIUM 500 MG PO DR TAB: DELAYED_RELEASE_TABLET | ORAL | 5 refills | Status: DC

## 2020-01-05 NOTE — Progress Notes (Signed)
I have read the note, and I agree with the clinical assessment and plan.  Charles K Willis   

## 2020-01-05 NOTE — Telephone Encounter (Signed)
Called  [facility]  Spoke to pharmacy tech on why pt's Tecfidera was not filled. Tech clarified that last time med was processed as generic and pt's ins covers med as brand only.  Tech fixed error, med has been filled, pt has been notified

## 2020-01-05 NOTE — Progress Notes (Signed)
PATIENT: Martin Stevenson DOB: [birth year]  REASON FOR VISIT: follow up HISTORY FROM: patient  HISTORY OF PRESENT ILLNESS: Today 01/05/20  Mr. Martin Stevenson is a 34 year old male with history of multiple sclerosis and intractable seizures.  He is on Keppra and Depakote (doses increased at last visit), continues to have seizures regularly, usually does not go entire month without an event, only 1 seizure in last 3 months with medication increase, this is really good for him.  Seizures are associated with impaired consciousness and behavioral manifestations, EEG has shown evidence of right frontotemporal abnormalities.  He is on Tecfidera for multiple sclerosis, has not received his recent shipment.  Recent MRI in February 2021 showed a new left brainstem lesion, need to consider switch to another MS medication.  Overall, MS is stable, does have some locking and cramping in his knees.  Denies falls.  No new MS symptoms.  He sees endocrinology, recent A1c 9.3. Is not interested in IV medications for MS. Lives in [place] with his fiance, and their 7 children, he is on disability, does not drive a car.  Presents today for evaluation unaccompanied.  HISTORY 09/15/2019 Dr. Jannifer Franklin: Mr. Martin Stevenson is a 34 year old right-handed black male with a history of multiple sclerosis and intractable seizures.  The patient is on Depakote and Keppra, he continues to have seizures with some regularity, he had 2 seizures yesterday, he may go up to 3 weeks without any seizures but usually does not go an entire month without an event.  The seizures are associated with impaired consciousness and behavioral manifestations, the patient has had evidence of right frontotemporal abnormalities on EEG study.  The patient claims that he has chronic problems with insomnia.  He is on Tecfidera for his multiple sclerosis, he tolerates this well.  He will have occasional sharp pains in the hands and in the left knee that comes and goes.  The patient reports  no new vision changes, numbness or weakness of the extremities.  The last MRI of the brain was done in 2019.  He returns for an evaluation.  He has diabetes, his hemoglobin A1c is 7.5.  He does have a family history of seizures.  REVIEW OF SYSTEMS: Out of a complete 14 system review of symptoms, the patient complains only of the following symptoms, and all other reviewed systems are negative.  Seizures  ALLERGIES: Allergies  Allergen Reactions  . Benazepril     States that it made his mouth burn  . Shellfish Allergy     HOME MEDICATIONS: Outpatient Medications Prior to Visit  Medication Sig Dispense Refill  . atorvastatin (LIPITOR) 20 MG tablet Take 1 tablet (20 mg total) by mouth daily. 30 tablet 3  . cholecalciferol (VITAMIN D3) 25 MCG (1000 UNIT) tablet Take 1,000 Units by mouth daily.    . Dimethyl Fumarate (TECFIDERA) 240 MG CPDR Take 1 capsule (240 mg total) by mouth 2 (two) times daily. 60 capsule 11  . glipiZIDE (GLUCOTROL XL) 5 MG 24 hr tablet Take 2 tablets (10 mg total) by mouth daily with breakfast. 60 tablet 3  . hydrochlorothiazide (HYDRODIURIL) 25 MG tablet Take 1 tablet (25 mg total) by mouth daily. 90 tablet 1  . metFORMIN (GLUCOPHAGE) 1000 MG tablet Take 1 tablet (1,000 mg total) by mouth 2 (two) times daily with a meal. 180 tablet 3  . Vitamin D, Ergocalciferol, (DRISDOL) 1.25 MG (50000 UNIT) CAPS capsule Take 1 capsule (50,000 Units total) by mouth every 7 (seven) days. 12 capsule 0  .  Cholecalciferol (VITAMIN D3) 125 MCG (5000 UT) CAPS Take 1 capsule (5,000 Units total) by mouth daily. 90 capsule 0  . divalproex (DEPAKOTE) 500 MG DR tablet 2 tablets in the morning, 3 in the evening 150 tablet 5  . levETIRAcetam (KEPPRA) 750 MG tablet One tablet in the morning and 2 in the evening 270 tablet 1   No facility-administered medications prior to visit.    PAST MEDICAL HISTORY: Past Medical History:  Diagnosis Date  . Diabetes (HCC)   . Fluid retention   .  Hypertension   . Multiple sclerosis (HCC) 12/29/2017  . Seizures (HCC)     PAST SURGICAL HISTORY: Past Surgical History:  Procedure Laterality Date  . MOUTH SURGERY     x3, jaw, hip, chin    FAMILY HISTORY: Family History  Problem Relation Age of Onset  . Diabetes Mother   . Pulmonary embolism Mother        hx of blood clots  . Diabetes Father     SOCIAL HISTORY: Social History   Socioeconomic History  . Marital status: Single    Spouse name: Not on file  . Number of children: 3  . Years of education: HS  . Highest education level: Not on file  Occupational History    Comment: Unemployed  Tobacco Use  . Smoking status: Never Smoker  . Smokeless tobacco: Never Used  Substance and Sexual Activity  . Alcohol use: No    Comment: Quit: 2012  . Drug use: No  . Sexual activity: Not on file  Other Topics Concern  . Not on file  Social History Narrative   Pt lives at home with his spouse.   Caffeine Use: very little   Right handed    Social Determinants of Health   Financial Resource Strain:   . Difficulty of Paying Living Expenses:   Food Insecurity:   . Worried About Programme researcher, broadcasting/film/video in the Last Year:   . Barista in the Last Year:   Transportation Needs:   . Freight forwarder (Medical):   Marland Kitchen Lack of Transportation (Non-Medical):   Physical Activity:   . Days of Exercise per Week:   . Minutes of Exercise per Session:   Stress:   . Feeling of Stress :   Social Connections:   . Frequency of Communication with Friends and Family:   . Frequency of Social Gatherings with Friends and Family:   . Attends Religious Services:   . Active Member of Clubs or Organizations:   . Attends Banker Meetings:   Marland Kitchen Marital Status:   Intimate Partner Violence:   . Fear of Current or Ex-Partner:   . Emotionally Abused:   Marland Kitchen Physically Abused:   . Sexually Abused:    PHYSICAL EXAM  Vitals:   01/05/20 0928  BP: (!) 141/87  Pulse: 90  Weight:  207 lb (93.9 kg)  Height: 5\' 7"  (1.702 m)   Body mass index is 32.42 kg/m.  Generalized: Well developed, in no acute distress   Neurological examination  Mentation: Alert oriented to time, place, history taking. Follows all commands speech and language fluent Cranial nerve II-XII: Pupils were equal round reactive to light. Extraocular movements were full, visual field were full on confrontational test. Facial sensation and strength were normal. Head turning and shoulder shrug  were normal and symmetric. Motor: The motor testing reveals 5 over 5 strength of all 4 extremities. Good symmetric motor tone is noted throughout.  Good  grip strength bilaterally. Sensory: Sensory testing is intact to soft touch on all 4 extremities. No evidence of extinction is noted.  Coordination: Cerebellar testing reveals good finger-nose-finger and heel-to-shin bilaterally.  Gait and station: Gait is normal. Tandem gait is normal. Romberg is negative. No drift is seen.  Reflexes: Deep tendon reflexes are symmetric and normal bilaterally.   DIAGNOSTIC DATA (LABS, IMAGING, TESTING) - I reviewed patient records, labs, notes, testing and imaging myself where available.  Lab Results  Component Value Date   WBC 6.3 09/15/2019   HGB 13.2 09/15/2019   HCT 40.4 09/15/2019   MCV 82 09/15/2019   PLT 270 09/15/2019      Component Value Date/Time   NA 138 12/21/2019 0830   NA 139 09/15/2019 1123   K 4.1 12/21/2019 0830   CL 101 12/21/2019 0830   CO2 27 12/21/2019 0830   GLUCOSE 210 (H) 12/21/2019 0830   BUN 12 12/21/2019 0830   BUN 9 09/15/2019 1123   CREATININE 0.90 12/21/2019 0830   CALCIUM 9.8 12/21/2019 0830   PROT 7.6 12/21/2019 0830   PROT 7.5 09/15/2019 1123   ALBUMIN 4.5 09/15/2019 1123   AST 19 12/21/2019 0830   ALT 20 12/21/2019 0830   ALKPHOS 46 09/15/2019 1123   BILITOT 0.5 12/21/2019 0830   BILITOT 0.3 09/15/2019 1123   GFRNONAA 110 12/21/2019 0830   GFRAA 128 12/21/2019 0830   Lab  Results  Component Value Date   CHOL 232 (H) 12/21/2019   HDL 63 12/21/2019   LDLCALC 143 (H) 12/21/2019   TRIG 135 12/21/2019   CHOLHDL 3.7 12/21/2019   Lab Results  Component Value Date   HGBA1C 9.3 (A) 12/28/2019   No results found for: VITAMINB12 Lab Results  Component Value Date   TSH 1.74 12/21/2019    ASSESSMENT AND PLAN 34 y.o. year old male  has a past medical history of Diabetes (HCC), Fluid retention, Hypertension, Multiple sclerosis (HCC) (12/29/2017), and Seizures (HCC). here with:  1.  Multiple sclerosis, relapsing remitting 2.  Intractable seizures  We will need to switch MS medications, he is currently taking Tecfidera.  Most recent MRI of the brain in February 2021, showed a new left brainstem lesion.  We talked about options, patient is not interested in IV medications.  He has decided on Mayzent.  He has signed the paperwork, we will ask the drug company to come out to his home to perform the blood testing, EKG, and macular edema screening.  He will remain on Tecfidera, until Mayzent is approved.  His seizures have significantly improved following medication adjustments, in the last 3 months has only had 1 seizure.  He will remain on Keppra 750 mg, 1 in the morning, 2 in the evening.  Depakote 500 mg, 2 in the morning, 3 in the evening.  I will see the patient back in 3 months, we can recheck seizure levels, ensure patient is tolerating Mayzent.  I spent 30 minutes of face-to-face and non-face-to-face time with patient.  This included previsit chart review, lab review, study review, order entry, electronic health record documentation, patient education.  Margie Ege, AGNP-C, DNP 01/05/2020, 10:02 AM [facility] [address] [place], [place] [address] [phone]

## 2020-01-05 NOTE — Patient Instructions (Signed)
Will start the process for Mayzent  Continue on Tecfidera for now  See you back in 3 months

## 2020-01-11 ENCOUNTER — Telehealth: Payer: Self-pay | Admitting: *Deleted

## 2020-01-11 NOTE — Telephone Encounter (Signed)
MAYZENT enrollemnt form faxed and received confirmation back from [facility] 01-10-20 [phone], fax [phone].

## 2020-01-17 ENCOUNTER — Ambulatory Visit: Payer: Medicaid Other | Admitting: Neurology

## 2020-01-18 ENCOUNTER — Encounter: Payer: Self-pay | Admitting: "Endocrinology

## 2020-01-18 ENCOUNTER — Other Ambulatory Visit: Payer: Self-pay

## 2020-01-18 ENCOUNTER — Ambulatory Visit (INDEPENDENT_AMBULATORY_CARE_PROVIDER_SITE_OTHER): Payer: Medicaid Other | Admitting: "Endocrinology

## 2020-01-18 VITALS — BP 118/78 | HR 90 | Ht 67.0 in | Wt 211.6 lb

## 2020-01-18 DIAGNOSIS — I10 Essential (primary) hypertension: Secondary | ICD-10-CM

## 2020-01-18 DIAGNOSIS — E782 Mixed hyperlipidemia: Secondary | ICD-10-CM | POA: Diagnosis not present

## 2020-01-18 DIAGNOSIS — E1165 Type 2 diabetes mellitus with hyperglycemia: Secondary | ICD-10-CM

## 2020-01-18 DIAGNOSIS — E559 Vitamin D deficiency, unspecified: Secondary | ICD-10-CM

## 2020-01-18 NOTE — Patient Instructions (Signed)

## 2020-01-18 NOTE — Progress Notes (Signed)
01/18/2020, 4:13 PM   Endocrinology follow-up note   Subjective:    Patient ID: Martin Stevenson, male    DOB: 01-Aug-1986.  Mateen Fye is being seen in follow-up  for management of currently uncontrolled symptomatic diabetes, hyperlipidemia, hypertension. PMD:  Avon Gully, MD.   Past Medical History:  Diagnosis Date  . Diabetes (HCC)   . Fluid retention   . Hypertension   . Multiple sclerosis (HCC) 12/29/2017  . Seizures (HCC)    Past Surgical History:  Procedure Laterality Date  . MOUTH SURGERY     x3, jaw, hip, chin   Social History   Socioeconomic History  . Marital status: Single    Spouse name: Not on file  . Number of children: 3  . Years of education: HS  . Highest education level: Not on file  Occupational History    Comment: Unemployed  Tobacco Use  . Smoking status: Never Smoker  . Smokeless tobacco: Never Used  Substance and Sexual Activity  . Alcohol use: No    Comment: Quit: 2012  . Drug use: No  . Sexual activity: Not on file  Other Topics Concern  . Not on file  Social History Narrative   Pt lives at home with his spouse.   Caffeine Use: very little   Right handed    Social Determinants of Health   Financial Resource Strain:   . Difficulty of Paying Living Expenses:   Food Insecurity:   . Worried About Programme researcher, broadcasting/film/video in the Last Year:   . Barista in the Last Year:   Transportation Needs:   . Freight forwarder (Medical):   Marland Kitchen Lack of Transportation (Non-Medical):   Physical Activity:   . Days of Exercise per Week:   . Minutes of Exercise per Session:   Stress:   . Feeling of Stress :   Social Connections:   . Frequency of Communication with Friends and Family:   . Frequency of Social Gatherings with Friends and Family:   . Attends Religious Services:   . Active Member of Clubs or Organizations:   . Attends Banker  Meetings:   Marland Kitchen Marital Status:    Outpatient Encounter Medications as of 01/18/2020  Medication Sig  . atorvastatin (LIPITOR) 20 MG tablet Take 1 tablet (20 mg total) by mouth daily.  . cholecalciferol (VITAMIN D3) 25 MCG (1000 UNIT) tablet Take 1,000 Units by mouth daily.  . Dimethyl Fumarate (TECFIDERA) 240 MG CPDR Take 1 capsule (240 mg total) by mouth 2 (two) times daily.  . divalproex (DEPAKOTE) 500 MG DR tablet 2 tablets in the morning, 3 in the evening  . glipiZIDE (GLUCOTROL XL) 5 MG 24 hr tablet Take 2 tablets (10 mg total) by mouth daily with breakfast.  . hydrochlorothiazide (HYDRODIURIL) 25 MG tablet Take 1 tablet (25 mg total) by mouth daily.  Marland Kitchen levETIRAcetam (KEPPRA) 750 MG tablet One tablet in the morning and 2 in the evening  . metFORMIN (GLUCOPHAGE) 1000 MG tablet Take 1 tablet (1,000 mg total) by mouth 2 (two) times daily with a meal.  . Vitamin D, Ergocalciferol, (DRISDOL) 1.25 MG (50000 UNIT)  CAPS capsule Take 1 capsule (50,000 Units total) by mouth every 7 (seven) days.   No facility-administered encounter medications on file as of 01/18/2020.    ALLERGIES: Allergies  Allergen Reactions  . Benazepril     States that it made his mouth burn  . Shellfish Allergy     VACCINATION STATUS:  There is no immunization history on file for this patient.  Diabetes He presents for his follow-up diabetic visit. He has type 2 diabetes mellitus. Onset time: He was diagnosed at approximate age of 34 years. His disease course has been improving. There are no hypoglycemic associated symptoms. Pertinent negatives for hypoglycemia include no confusion, headaches, pallor or seizures. Associated symptoms include polydipsia and polyuria. Pertinent negatives for diabetes include no chest pain, no fatigue, no polyphagia and no weakness. There are no hypoglycemic complications. Symptoms are improving. There are no diabetic complications. Risk factors for coronary artery disease include diabetes  mellitus, male sex, obesity and sedentary lifestyle. Current diabetic treatments: He is on Janumet 50/1000 mg p.o. twice daily.  His weight is fluctuating minimally. He is following a generally unhealthy diet. When asked about meal planning, he reported none. He has not had a previous visit with a dietitian. He rarely participates in exercise. His overall blood glucose range is >200 mg/dl. (He did not monitor enough.  He presents with 20 readings in the last 30 days averaging 236.  His most recent A1c was 9.3%.) An ACE inhibitor/angiotensin II receptor blocker is not being taken. He does not see a podiatrist.Eye exam is not current.  Hyperlipidemia This is a chronic problem. The current episode started more than 1 year ago. The problem is uncontrolled. Pertinent negatives include no chest pain. He is currently on no antihyperlipidemic treatment. Risk factors for coronary artery disease include diabetes mellitus, dyslipidemia, male sex, obesity, a sedentary lifestyle and family history.   Review of systems  Constitutional: + Minimally fluctuating body weight,  current  Body mass index is 33.14 kg/m. , no fatigue, no subjective hyperthermia, no subjective hypothermia Eyes: no blurry vision, no xerophthalmia ENT: no sore throat, no nodules palpated in throat, no dysphagia/odynophagia, no hoarseness Cardiovascular: no Chest Pain, no Shortness of Breath, no palpitations, no leg swelling Respiratory: no cough, no shortness of breath Gastrointestinal: no Nausea/Vomiting/Diarhhea Musculoskeletal: no muscle/joint aches Skin: no rashes, no hyperemia Neurological: no tremors, no numbness, no tingling, no dizziness Psychiatric: no depression, no anxiety     Objective:    BP 118/78   Pulse 90   Ht 5\' 7"  (1.702 m)   Wt 211 lb 9.6 oz (96 kg)   BMI 33.14 kg/m   Wt Readings from Last 3 Encounters:  01/18/20 211 lb 9.6 oz (96 kg)  01/05/20 207 lb (93.9 kg)  12/28/19 208 lb 3.2 oz (94.4 kg)      Physical Exam- Limited  Constitutional:  Body mass index is 33.14 kg/m. , not in acute distress, normal state of mind Eyes:  EOMI, no exophthalmos Neck: Supple Thyroid: No gross goiter Respiratory: Adequate breathing efforts Musculoskeletal: no gross deformities, strength intact in all four extremities, no gross restriction of joint movements Skin:  no rashes, no hyperemia Neurological: no tremor with outstretched hands,       Recent Results (from the past 2160 hour(s))  COMPLETE METABOLIC PANEL WITH GFR     Status: Abnormal   Collection Time: 12/21/19  8:30 AM  Result Value Ref Range   Glucose, Bld 210 (H) 65 - 99 mg/dL  Comment: .            Fasting reference interval . For someone without known diabetes, a glucose value >125 mg/dL indicates that they may have diabetes and this should be confirmed with a follow-up test. .    BUN 12 7 - 25 mg/dL   Creat 1.12 1.62 - 4.46 mg/dL   GFR, Est Non African American 110 > OR = 60 mL/min/1.44m2   GFR, Est African American 128 > OR = 60 mL/min/1.64m2   BUN/Creatinine Ratio NOT APPLICABLE 6 - 22 (calc)   Sodium 138 135 - 146 mmol/L   Potassium 4.1 3.5 - 5.3 mmol/L   Chloride 101 98 - 110 mmol/L   CO2 27 20 - 32 mmol/L   Calcium 9.8 8.6 - 10.3 mg/dL   Total Protein 7.6 6.1 - 8.1 g/dL   Albumin 4.5 3.6 - 5.1 g/dL   Globulin 3.1 1.9 - 3.7 g/dL (calc)   AG Ratio 1.5 1.0 - 2.5 (calc)   Total Bilirubin 0.5 0.2 - 1.2 mg/dL   Alkaline phosphatase (APISO) 45 36 - 130 U/L   AST 19 10 - 40 U/L   ALT 20 9 - 46 U/L  Microalbumin/Creatinine Ratio, Urine     Status: None   Collection Time: 12/21/19  8:30 AM  Result Value Ref Range   Creatinine, Urine 296 20 - 320 mg/dL   Microalb, Ur 1.0 mg/dL    Comment: Reference Range Not established    Microalb Creat Ratio 3 <30 mcg/mg creat    Comment: . The ADA defines abnormalities in albumin excretion as follows: Marland Kitchen Category         Result (mcg/mg creatinine) . Normal                     <30 Microalbuminuria         30-299  Clinical albuminuria   > OR = 300 . The ADA recommends that at least two of three specimens collected within a 3-6 month period be abnormal before considering a patient to be within a diagnostic category.   Lipid Panel     Status: Abnormal   Collection Time: 12/21/19  8:30 AM  Result Value Ref Range   Cholesterol 232 (H) <200 mg/dL   HDL 63 > OR = 40 mg/dL   Triglycerides 950 <722 mg/dL   LDL Cholesterol (Calc) 143 (H) mg/dL (calc)    Comment: Reference range: <100 . Desirable range <100 mg/dL for primary prevention;   <70 mg/dL for patients with CHD or diabetic patients  with > or = 2 CHD risk factors. Marland Kitchen LDL-C is now calculated using the Martin-Hopkins  calculation, which is a validated novel method providing  better accuracy than the Friedewald equation in the  estimation of LDL-C.  Horald Pollen et al. Lenox Ahr. 5750;518(33): 2061-2068  (http://education.[facility].com/faq/FAQ164)    Total CHOL/HDL Ratio 3.7 <5.0 (calc)   Non-HDL Cholesterol (Calc) 169 (H) <130 mg/dL (calc)    Comment: For patients with diabetes plus 1 major ASCVD risk  factor, treating to a non-HDL-C goal of <100 mg/dL  (LDL-C of <58 mg/dL) is considered a therapeutic  option.   TSH SOLSTAS     Status: None   Collection Time: 12/21/19  8:30 AM  Result Value Ref Range   TSH 1.74 0.40 - 4.50 mIU/L  T4, free SOLSTAS     Status: None   Collection Time: 12/21/19  8:30 AM  Result Value Ref Range   Free T4 1.4  0.8 - 1.8 ng/dL  VITAMIN D 25 Hydroxy (Vit-D Deficiency, Fractures)     Status: Abnormal   Collection Time: 12/21/19  8:30 AM  Result Value Ref Range   Vit D, 25-Hydroxy 10 (L) 30 - 100 ng/mL    Comment: Vitamin D Status         25-OH Vitamin D: . Deficiency:                    <20 ng/mL Insufficiency:             20 - 29 ng/mL Optimal:                 > or = 30 ng/mL . For 25-OH Vitamin D testing on patients on  D2-supplementation and patients for whom  quantitation  of D2 and D3 fractions is required, the Quest[facility]ureD(TM) 25-OH VIT D, (D2,D3), LC/MS/MS is recommended: order  code [phone] (patients >47yrs). See Note 1 . Note 1 . For additional information, please refer to  http://education.[facility].com/faq/FAQ199  (This link is being provided for informational/ educational purposes only.)   HgB A1c     Status: Abnormal   Collection Time: 12/28/19  8:50 AM  Result Value Ref Range   Hemoglobin A1C 9.3 (A) 4.0 - 5.6 %   HbA1c POC (<> result, manual entry)     HbA1c, POC (prediabetic range)     HbA1c, POC (controlled diabetic range)      Assessment & Plan:   1. Uncontrolled type 2 diabetes mellitus with hyperglycemia ([place])  - Miriam Six has currently uncontrolled symptomatic type 2 DM since 34 years of age.  - He did not monitor enough.  He presents with 20 readings in the last 30 days averaging 236.  His most recent A1c was 9.3%. - His point-of-care A1c is 9.3% increasing from 7.5%.    -He does not report any gross complications from his diabetes, however patient with multiple sclerosis as well as seizure disorders,  and he remains at a high risk for more acute and chronic complications which include CAD, CVA, CKD, retinopathy, and neuropathy. These are all discussed in detail with him.  - I have counseled him on diet management and weight loss, by adopting a carbohydrate restricted/protein rich diet.  - he  admits there is a room for improvement in his diet and drink choices. -  Suggestion is made for him to avoid simple carbohydrates  from his diet including Cakes, Sweet Desserts / Pastries, Ice Cream, Soda (diet and regular), Sweet Tea, Candies, Chips, Cookies, Sweet Pastries,  Store Bought Juices, Alcohol in Excess of  1-2 drinks a day, Artificial Sweeteners, Coffee Creamer, and "Sugar-free" Products. This will help patient to have stable blood glucose profile and potentially avoid unintended weight gain.   - I  encouraged him to switch to  unprocessed or minimally processed complex starch and increased protein intake (animal or plant source), fruits, and vegetables.  - he is advised to stick to a routine mealtimes to eat 3 meals  a day and avoid unnecessary snacks ( to snack only to correct hypoglycemia).    -His insurance did not provide coverage for Janumet which was discontinued.   -He is approached to start monitoring blood glucose twice a day-daily before breakfast and at bedtime.   -He is encouraged to call clinic for glycemic profile less than 70 or greater than 200x3. -He will likely need at least basal insulin in order for him to achieve control of diabetes  to target.  -In the meantime, he is advised to continue Metformin 1000 mg p.o. twice daily-daily after breakfast and supper. -He will continue glipizide 10 mg p.o. daily at breakfast.  - Patient specific target  A1c;  LDL, HDL, Triglycerides,  were discussed in detail.  2) BP/HTN:    He is allergic to ACE inhibitors.  His blood pressure is controlled to target.  He is advised to continue hydrochlorothiazide 25 mg p.o. daily at breakfast.   3) Lipids/HPL: His previsit labs show increased LDL at 143 from 100.  He was recently initiated on atorvastatin 20 mg p.o. nightly.  He is tolerating and advised to continue. Side effects and precautions discussed with him.     4)  Weight/Diet:  Body mass index is 33.14 kg/m.  - clearly complicating his diabetes care.  I discussed with him the fact that loss of 5 - 10% of his  current body weight will have the most impact on his diabetes management.  CDE Consult will be initiated . Exercise, and detailed carbohydrates information provided  -  detailed on discharge instructions.  5) vitamin D deficiency: He was recently diagnosed with profound vitamin D deficiency.  He is currently on vitamin D supplement using vitamin D2 50,000 units weekly.     5) Chronic Care/Health Maintenance:  -he   is  encouraged to initiate and continue to follow up with Ophthalmology, Dentist,  Podiatrist at least yearly or according to recommendations, and advised to  stay away from smoking. I have recommended yearly flu vaccine and pneumonia vaccine at least every 5 years; moderate intensity exercise for up to 150 minutes weekly; and  sleep for at least 7 hours a day.  - I advised patient to maintain close follow up with Avon Gully, MD for primary care needs.  - Time spent on this patient care encounter:  35 min, of which > 50% was spent in  counseling and the rest reviewing his blood glucose logs , discussing his hypoglycemia and hyperglycemia episodes, reviewing his current and  previous labs / studies  ( including abstraction from other facilities) and medications  doses and developing a  long term treatment plan and documenting his care.   Please refer to Patient Instructions for Blood Glucose Monitoring and Insulin/Medications Dosing Guide"  in media tab for additional information. Please  also refer to " Patient Self Inventory" in the Media  tab for reviewed elements of pertinent patient history.  Orbin Gowan participated in the discussions, expressed understanding, and voiced agreement with the above plans.  All questions were answered to his satisfaction. he is encouraged to contact clinic should he have any questions or concerns prior to his return visit.   Follow up plan: - Return in about 9 weeks (around 03/21/2020) for Bring Meter and Logs- A1c in Office.  Marquis Lunch, MD [facility] [address] [place], [place] [address] Phone: [phone]  Fax: [phone]    01/18/2020, 4:13 PM  This note was partially dictated with voice recognition software. Similar sounding words can be transcribed inadequately or may not  be corrected upon review.

## 2020-01-31 ENCOUNTER — Telehealth: Payer: Self-pay | Admitting: *Deleted

## 2020-01-31 NOTE — Telephone Encounter (Signed)
Initiated PA  KEY [id number]  01-12-20 denied 01-18-20-for mayzent denied APPEAL started 01-26-20. Determination pending.

## 2020-02-07 NOTE — Telephone Encounter (Addendum)
We are trying to switch the patient to Mayzent.  EKG evaluation showed normal sinus rhythm with sinus arrhythmia (normal variant for age) without conduction abnormalities.   Laboratory evaluation showed normal routine labs (WBC 5.4, Hgb 13.9, platelets 180, absolute lymphocyte 2.8, alkaline phosphatase 48, AST 20, ALT 23, varicella zoster was 572 (immune > 165).  Ophthalmology evaluation showed no apparent macular edema in the right or left eye.  Received genotype testing, the standard maintenance dose of 2 mg once daily is recommended.   Can we continue to work on getting the Mayzent covered and getting him started on the medication.  He is currently taking Tecfidera. Please check with Dr. Anne Hahn next week about the wash out period between Tecfidera and Mayzent. Also update the patient.

## 2020-02-14 ENCOUNTER — Telehealth: Payer: Self-pay | Admitting: Neurology

## 2020-02-14 ENCOUNTER — Telehealth: Payer: Self-pay | Admitting: "Endocrinology

## 2020-02-14 NOTE — Telephone Encounter (Signed)
Hailey from [facility] Support called wanting to know if the clear for therapy form that she faxed has been received. Please advise. [phone] ext [phone]

## 2020-02-14 NOTE — Telephone Encounter (Signed)
I spoke with Dr. Anne Hahn. Tecfidera washout is fairly immediate so when pt gets the [facility] he should be able to start it without waiting for a washout from tecfidera.

## 2020-02-14 NOTE — Telephone Encounter (Signed)
I have not received a clear for therapy form for this pt's mayzent.   I called Hailey to discuss. No answer, left a message asking her to call us back.  If she would like to refax it, please ask her to fax it to [phone] Attn: Baxter Hire.  Of note, we have not received the appeal decision from medicaid yet for pt's mayzent.

## 2020-02-14 NOTE — Telephone Encounter (Signed)
I called pt to discuss. No answer, left a message asking him to call me back. 

## 2020-02-15 ENCOUNTER — Telehealth: Payer: Self-pay | Admitting: "Endocrinology

## 2020-02-15 NOTE — Telephone Encounter (Signed)
I called and spoke to [place] with Mayzent.  She stated PA done denied 02-01-20, appeal done denied 02-02-20.  Referred to NPAS 02-07-20 ([employer] pt assistance foundation), and is in process.  Received treatment clearance form to be filled out and signed by Dr. Anne Hahn as SS/NP is out.

## 2020-02-15 NOTE — Telephone Encounter (Signed)
Pt requesting refill on Accu Check Aviva Plus

## 2020-02-16 NOTE — Telephone Encounter (Signed)
I called pt. I discussed this with him. He is aware that we are working on getting his mayzent covered but will likely need patient assistance. Pt verbalized understanding of results and recommendations. Pt had no questions at this time but was encouraged to call back if questions arise.

## 2020-02-20 MED ORDER — GLUCOSE BLOOD VI STRP: 1.0000 | ORAL_STRIP | Freq: Two times a day (BID) | 3 refills | Status: DC

## 2020-02-20 NOTE — Telephone Encounter (Signed)
Treatment Clearance Authorization for mayzent faxed to [facility] [employer] [facility]/[employer]. Signed by Maralyn Sago, NP. Received a receipt of confirmation.

## 2020-02-20 NOTE — Telephone Encounter (Signed)
Sent rx refill for accucheck aviva plus test strips to [facility] in [place].

## 2020-02-20 NOTE — Telephone Encounter (Signed)
I spoke with Maralyn Sago, NP. Since pt does not have a significant cardiac history (EKG showed NSR with sinus arrhythmia, normal variant for age) he does NOT need an FDO. This was reflected on the [employer] Patient Support Program.

## 2020-02-28 NOTE — Telephone Encounter (Signed)
I called [employer] Patient Support. Spoke with [employer]. She reports that the pt was approved on 02/24/2020 and pt should be contacted within the next day or two to set up shipment. The shipment will come from the foundation and they need nothing further from our office.

## 2020-03-05 NOTE — Telephone Encounter (Signed)
I called pt. He has not received a shipment of mayzent and was told they were working with his insurance on something.  I called [employer] Patient Support.They do not see why pt has not received shipment but transferred me to the pharmacy. I spoke with [facility] with [employer]. They have everything they need and will contact the pt for shipment of mayzent.

## 2020-03-12 NOTE — Telephone Encounter (Signed)
I called pt. He received his shipment of mayzent. His last dose of tecfidera was yesterday. Per my conversation with Dr. Anne Hahn on 02/14/2020, pt may start mayzent when he receives it. Pt will start mayzent today and I will call him next week to make sure he is tolerating it well. Pt verbalized understanding.

## 2020-03-19 ENCOUNTER — Other Ambulatory Visit: Payer: Self-pay | Admitting: "Endocrinology

## 2020-03-22 ENCOUNTER — Other Ambulatory Visit: Payer: Self-pay

## 2020-03-22 DIAGNOSIS — E1165 Type 2 diabetes mellitus with hyperglycemia: Secondary | ICD-10-CM

## 2020-03-22 NOTE — Progress Notes (Signed)
a 

## 2020-03-26 ENCOUNTER — Ambulatory Visit (INDEPENDENT_AMBULATORY_CARE_PROVIDER_SITE_OTHER): Payer: Medicaid Other | Admitting: "Endocrinology

## 2020-03-26 ENCOUNTER — Encounter: Payer: Self-pay | Admitting: "Endocrinology

## 2020-03-26 ENCOUNTER — Other Ambulatory Visit: Payer: Self-pay

## 2020-03-26 VITALS — BP 120/87 | HR 64 | Ht 67.0 in | Wt 209.4 lb

## 2020-03-26 DIAGNOSIS — E1165 Type 2 diabetes mellitus with hyperglycemia: Secondary | ICD-10-CM | POA: Diagnosis not present

## 2020-03-26 DIAGNOSIS — I10 Essential (primary) hypertension: Secondary | ICD-10-CM

## 2020-03-26 DIAGNOSIS — E559 Vitamin D deficiency, unspecified: Secondary | ICD-10-CM | POA: Diagnosis not present

## 2020-03-26 DIAGNOSIS — E782 Mixed hyperlipidemia: Secondary | ICD-10-CM | POA: Diagnosis not present

## 2020-03-26 LAB — POCT GLYCOSYLATED HEMOGLOBIN (HGB A1C): Hemoglobin A1C: 8.2 % — AB (ref 4.0–5.6)

## 2020-03-26 NOTE — Progress Notes (Signed)
03/26/2020, 5:03 PM   Endocrinology follow-up note   Subjective:    Patient ID: Martin Stevenson, male    DOB: [birth year].  Martin Stevenson is being seen in follow-up  for management of currently uncontrolled symptomatic diabetes, hyperlipidemia, hypertension. PMD:  Avon Gully, MD.   Past Medical History:  Diagnosis Date  . Diabetes (HCC)   . Fluid retention   . Hypertension   . Multiple sclerosis (HCC) 12/29/2017  . Seizures (HCC)    Past Surgical History:  Procedure Laterality Date  . MOUTH SURGERY     x3, jaw, hip, chin   Social History   Socioeconomic History  . Marital status: Single    Spouse name: Not on file  . Number of children: 3  . Years of education: HS  . Highest education level: Not on file  Occupational History    Comment: Unemployed  Tobacco Use  . Smoking status: Never Smoker  . Smokeless tobacco: Never Used  Vaping Use  . Vaping Use: Never used  Substance and Sexual Activity  . Alcohol use: No    Comment: Quit: 2012  . Drug use: No  . Sexual activity: Not on file  Other Topics Concern  . Not on file  Social History Narrative   Pt lives at home with his spouse.   Caffeine Use: very little   Right handed    Social Determinants of Health   Financial Resource Strain:   . Difficulty of Paying Living Expenses:   Food Insecurity:   . Worried About Programme researcher, broadcasting/film/video in the Last Year:   . Barista in the Last Year:   Transportation Needs:   . Freight forwarder (Medical):   Marland Kitchen Lack of Transportation (Non-Medical):   Physical Activity:   . Days of Exercise per Week:   . Minutes of Exercise per Session:   Stress:   . Feeling of Stress :   Social Connections:   . Frequency of Communication with Friends and Family:   . Frequency of Social Gatherings with Friends and Family:   . Attends Religious Services:   . Active Member of Clubs or Organizations:    . Attends Banker Meetings:   Marland Kitchen Marital Status:    Outpatient Encounter Medications as of 03/26/2020  Medication Sig  . atorvastatin (LIPITOR) 20 MG tablet Take 1 tablet (20 mg total) by mouth daily.  . cholecalciferol (VITAMIN D3) 25 MCG (1000 UNIT) tablet Take 1,000 Units by mouth daily.  . Dimethyl Fumarate (TECFIDERA) 240 MG CPDR Take 1 capsule (240 mg total) by mouth 2 (two) times daily.  . divalproex (DEPAKOTE) 500 MG DR tablet 2 tablets in the morning, 3 in the evening  . glipiZIDE (GLUCOTROL XL) 5 MG 24 hr tablet Take 2 tablets (10 mg total) by mouth daily with breakfast.  . glucose blood test strip 1 each by Other route 2 (two) times daily. Use as instructed to test blood glucose two times daily.  . hydrochlorothiazide (HYDRODIURIL) 25 MG tablet Take 1 tablet (25 mg total) by mouth daily.  Marland Kitchen levETIRAcetam (KEPPRA) 750 MG tablet One tablet in the morning and 2 in  the evening  . metFORMIN (GLUCOPHAGE) 1000 MG tablet Take 1 tablet (1,000 mg total) by mouth 2 (two) times daily with a meal.  . Vitamin D, Ergocalciferol, (DRISDOL) 1.25 MG (50000 UNIT) CAPS capsule Take 1 capsule by mouth once a week   No facility-administered encounter medications on file as of 03/26/2020.    ALLERGIES: Allergies  Allergen Reactions  . Benazepril     States that it made his mouth burn  . Shellfish Allergy     VACCINATION STATUS:  There is no immunization history on file for this patient.  Diabetes He presents for his follow-up diabetic visit. He has type 2 diabetes mellitus. Onset time: He was diagnosed at approximate age of 66 years. His disease course has been improving. There are no hypoglycemic associated symptoms. Pertinent negatives for hypoglycemia include no confusion, headaches, pallor or seizures. Associated symptoms include polydipsia and polyuria. Pertinent negatives for diabetes include no chest pain, no fatigue, no polyphagia and no weakness. There are no hypoglycemic  complications. Symptoms are improving. There are no diabetic complications. Risk factors for coronary artery disease include diabetes mellitus, male sex, obesity and sedentary lifestyle. Current diabetic treatments: He is on Janumet 50/1000 mg p.o. twice daily.  His weight is fluctuating minimally. He is following a generally unhealthy diet. When asked about meal planning, he reported none. He has not had a previous visit with a dietitian. He rarely participates in exercise. His home blood glucose trend is decreasing steadily. His breakfast blood glucose range is generally 140-180 mg/dl. His bedtime blood glucose range is generally 180-200 mg/dl. His overall blood glucose range is 180-200 mg/dl. (He presents with his meter showing improving glycemic profile, point-of-care A1c is 8.2% improving from 9.3%.  He denies hypoglycemia.   ) An ACE inhibitor/angiotensin II receptor blocker is not being taken. He does not see a podiatrist.Eye exam is not current.  Hyperlipidemia This is a chronic problem. The current episode started more than 1 year ago. The problem is uncontrolled. Pertinent negatives include no chest pain. He is currently on no antihyperlipidemic treatment. Risk factors for coronary artery disease include diabetes mellitus, dyslipidemia, male sex, obesity, a sedentary lifestyle and family history.   Review of systems  Constitutional: + Minimally fluctuating body weight,  current  Body mass index is 33.14 kg/m. , no fatigue, no subjective hyperthermia, no subjective hypothermia Eyes: no blurry vision, no xerophthalmia ENT: no sore throat, no nodules palpated in throat, no dysphagia/odynophagia, no hoarseness Cardiovascular: no Chest Pain, no Shortness of Breath, no palpitations, no leg swelling Respiratory: no cough, no shortness of breath Gastrointestinal: no Nausea/Vomiting/Diarhhea Musculoskeletal: no muscle/joint aches Skin: no rashes, no hyperemia Neurological: no tremors, no numbness,  no tingling, no dizziness Psychiatric: no depression, no anxiety     Objective:    BP 120/87   Pulse 64   Ht 5\' 7"  (1.702 m)   Wt 209 lb 6.4 oz (95 kg)   BMI 32.80 kg/m   Wt Readings from Last 3 Encounters:  03/26/20 209 lb 6.4 oz (95 kg)  01/18/20 211 lb 9.6 oz (96 kg)  01/05/20 207 lb (93.9 kg)     Physical Exam- Limited  Constitutional:  Body mass index is 32.8 kg/m. , not in acute distress, normal state of mind Eyes:  EOMI, no exophthalmos Neck: Supple Thyroid: No gross goiter Respiratory: Adequate breathing efforts Musculoskeletal: no gross deformities, strength intact in all four extremities, no gross restriction of joint movements Skin:  no rashes, no hyperemia Neurological: no tremor  with outstretched hands    Recent Results (from the past 2160 hour(s))  HgB A1c     Status: Abnormal   Collection Time: 12/28/19  8:50 AM  Result Value Ref Range   Hemoglobin A1C 9.3 (A) 4.0 - 5.6 %   HbA1c POC (<> result, manual entry)     HbA1c, POC (prediabetic range)     HbA1c, POC (controlled diabetic range)    HgB A1c     Status: Abnormal   Collection Time: 03/26/20 11:02 AM  Result Value Ref Range   Hemoglobin A1C 8.2 (A) 4.0 - 5.6 %   HbA1c POC (<> result, manual entry)     HbA1c, POC (prediabetic range)     HbA1c, POC (controlled diabetic range)     CMP Latest Ref Rng & Units 12/21/2019 09/15/2019 04/06/2019  Glucose 65 - 99 mg/dL 924(Q) 683(M) -  BUN 7 - 25 mg/dL 12 9 11   Creatinine 0.60 - 1.35 mg/dL 1.96 2.22 0.9  Sodium 979 - 146 mmol/L 138 139 141  Potassium 3.5 - 5.3 mmol/L 4.1 4.2 4.0  Chloride 98 - 110 mmol/L 101 100 -  CO2 20 - 32 mmol/L 27 23 -  Calcium 8.6 - 10.3 mg/dL 9.8 9.4 -  Total Protein 6.1 - 8.1 g/dL 7.6 7.5 -  Total Bilirubin 0.2 - 1.2 mg/dL 0.5 0.3 -  Alkaline Phos 39 - 117 IU/L - 46 45  AST 10 - 40 U/L 19 30 27   ALT 9 - 46 U/L 20 41 32   Lipid Panel     Component Value Date/Time   CHOL 232 (H) 12/21/2019 0830   CHOL 198 09/21/2018 1453    TRIG 135 12/21/2019 0830   HDL 63 12/21/2019 0830   HDL 76 09/21/2018 1453   CHOLHDL 3.7 12/21/2019 0830   LDLCALC 143 (H) 12/21/2019 0830   LABVLDL 22 09/21/2018 1453    Assessment & Plan:   1. Uncontrolled type 2 diabetes mellitus with hyperglycemia (HCC)  - Martin Stevenson has currently uncontrolled symptomatic type 2 DM since 34 years of age.  He presents with his meter showing improving glycemic profile, point-of-care A1c is 8.2% improving from 9.3%.  He denies hypoglycemia.    -He does not report any gross complications from his diabetes, however patient with multiple sclerosis as well as seizure disorders,  and he remains at a high risk for more acute and chronic complications which include CAD, CVA, CKD, retinopathy, and neuropathy. These are all discussed in detail with him.  - I have counseled him on diet management and weight loss, by adopting a carbohydrate restricted/protein rich diet.  - he  admits there is a room for improvement in his diet and drink choices. -  Suggestion is made for him to avoid simple carbohydrates  from his diet including Cakes, Sweet Desserts / Pastries, Ice Cream, Soda (diet and regular), Sweet Tea, Candies, Chips, Cookies, Sweet Pastries,  Store Bought Juices, Alcohol in Excess of  1-2 drinks a day, Artificial Sweeteners, Coffee Creamer, and "Sugar-free" Products. This will help patient to have stable blood glucose profile and potentially avoid unintended weight gain.   - I encouraged him to switch to  unprocessed or minimally processed complex starch and increased protein intake (animal or plant source), fruits, and vegetables.  - he is advised to stick to a routine mealtimes to eat 3 meals  a day and avoid unnecessary snacks ( to snack only to correct hypoglycemia).    -His insurance did not provide  coverage for Janumet , he has responded to Janumet. -He is advised to continue Metformin 1000 mg p.o. twice daily, glipizide 10 mg daily at  breakfast. -He is willing to continue monitoring blood glucose at least once a day-daily before breakfast and at any other time as needed.   -He is encouraged to call clinic for glycemic profile less than 70 or greater than 200x3. -Based on his presentation with controlled fasting glycemic profile, will not need insulin treatment for now.    - Patient specific target  A1c;  LDL, HDL, Triglycerides,  were discussed in detail.  2) BP/HTN:    He is allergic to ACE inhibitors.  His blood pressure is controlled to target.  He is advised to continue hydrochlorothiazide 25 mg p.o. daily at breakfast.   3) Lipids/HPL: His previsit labs show increased LDL at 143 from 100.  He was recently initiated on atorvastatin 20 mg p.o. nightly.  He is tolerating and advised to continue. Side effects and precautions discussed with him.     4)  Weight/Diet:  Body mass index is 32.8 kg/m.  - clearly complicating his diabetes care.  I discussed with him the fact that loss of 5 - 10% of his  current body weight will have the most impact on his diabetes management.  CDE Consult will be initiated . Exercise, and detailed carbohydrates information provided  -  detailed on discharge instructions.  5) vitamin D deficiency: He was recently diagnosed with profound vitamin D deficiency.  He is currently on vitamin D supplement using vitamin D2 50,000 units weekly.     5) Chronic Care/Health Maintenance:  -he   is encouraged to initiate and continue to follow up with Ophthalmology, Dentist,  Podiatrist at least yearly or according to recommendations, and advised to  stay away from smoking. I have recommended yearly flu vaccine and pneumonia vaccine at least every 5 years; moderate intensity exercise for up to 150 minutes weekly; and  sleep for at least 7 hours a day.  - I advised patient to maintain close follow up with Avon Gully, MD for primary care needs.  - Time spent on this patient care encounter:  35 min, of which  > 50% was spent in  counseling and the rest reviewing his blood glucose logs , discussing his hypoglycemia and hyperglycemia episodes, reviewing his current and  previous labs / studies  ( including abstraction from other facilities) and medications  doses and developing a  long term treatment plan and documenting his care.   Please refer to Patient Instructions for Blood Glucose Monitoring and Insulin/Medications Dosing Guide"  in media tab for additional information. Please  also refer to " Patient Self Inventory" in the Media  tab for reviewed elements of pertinent patient history.  Martin Stevenson participated in the discussions, expressed understanding, and voiced agreement with the above plans.  All questions were answered to his satisfaction. he is encouraged to contact clinic should he have any questions or concerns prior to his return visit.   Follow up plan: - Return in about 3 months (around 06/26/2020) for F/U with Pre-visit Labs, Meter, Logs, A1c here.Marquis Lunch, MD [facility] [address] [place], [place] [address] Phone: [phone]  Fax: [phone]    03/26/2020, 5:03 PM  This note was partially dictated with voice recognition software. Similar sounding words can be transcribed inadequately or may not  be corrected upon review.

## 2020-03-26 NOTE — Telephone Encounter (Signed)
I called pt. He reports that he received his mayzent shipment, started taking the mayzent, and denies any side effects or problems. He will call us for questions or concerns. I reminded him of his 04/10/2020 appt. Pt verbalized understanding.

## 2020-03-26 NOTE — Patient Instructions (Signed)

## 2020-04-02 ENCOUNTER — Other Ambulatory Visit: Payer: Self-pay | Admitting: "Endocrinology

## 2020-04-10 ENCOUNTER — Encounter: Payer: Self-pay | Admitting: Neurology

## 2020-04-10 ENCOUNTER — Other Ambulatory Visit: Payer: Self-pay

## 2020-04-10 ENCOUNTER — Ambulatory Visit: Payer: Medicaid Other | Admitting: Neurology

## 2020-04-10 VITALS — BP 152/96 | HR 90 | Ht 67.0 in | Wt 206.0 lb

## 2020-04-10 DIAGNOSIS — G35 Multiple sclerosis: Secondary | ICD-10-CM

## 2020-04-10 DIAGNOSIS — R569 Unspecified convulsions: Secondary | ICD-10-CM | POA: Diagnosis not present

## 2020-04-10 MED ORDER — GABAPENTIN 300 MG PO CAPS: 300.0000 mg | ORAL_CAPSULE | Freq: Every day | ORAL | 5 refills | Status: DC

## 2020-04-10 NOTE — Patient Instructions (Addendum)
Start gabapentin 300 mg at bedtime to help with aching in your legs, also sleeping Continue Mayzent  Check blood work today  See you back in 5 months

## 2020-04-10 NOTE — Progress Notes (Signed)
PATIENT: Martin Stevenson DOB: 11/03/1985  REASON FOR VISIT: follow up HISTORY FROM: patient  HISTORY OF PRESENT ILLNESS: Today 04/10/20 Mr. Donigan is a 34 year old male with history of multiple sclerosis and intractable seizures. He is on Keppra and Depakote. EEG has shown evidence of right frontotemporal abnormalities. Has been on Mayzent since early August, was switched from Tecfidera as MRI in February showed new left brainstem lesion.  Is tolerating well.  MS overall stable.  Has some aching pain in his legs and hands at night, prevent him from sleeping.  No falls, no changes to balance, or vision.  Occasionally, may feel the right leg is weaker.  For seizures, one seizure last night, can feel it coming on, will sit down, will have impaired consciousness, and behavioral manifestations (give middle finger, say curse words).  He had 3 seizures in July.  In February, his medications were adjusted, has had less seizure since.  He does not drive, he is on disability.  A1c has improved, 8.2.  No new problems or concerns.  Presents today for evaluation unaccompanied.  HISTORY 01/05/2020 SS: Mr. Eckert is a 34 year old male with history of multiple sclerosis and intractable seizures.  He is on Keppra and Depakote (doses increased at last visit), continues to have seizures regularly, usually does not go entire month without an event, only 1 seizure in last 3 months with medication increase, this is really good for him.  Seizures are associated with impaired consciousness and behavioral manifestations, EEG has shown evidence of right frontotemporal abnormalities.  He is on Tecfidera for multiple sclerosis, has not received his recent shipment.  Recent MRI in February 2021 showed a new left brainstem lesion, need to consider switch to another MS medication.  Overall, MS is stable, does have some locking and cramping in his knees.  Denies falls.  No new MS symptoms.  He sees endocrinology, recent A1c 9.3. Is not  interested in IV medications for MS. Lives in [place] with his fiance, and their 7 children, he is on disability, does not drive a car.  Presents today for evaluation unaccompanied.   REVIEW OF SYSTEMS: Out of a complete 14 system review of symptoms, the patient complains only of the following symptoms, and all other reviewed systems are negative.  Seizure  ALLERGIES: Allergies  Allergen Reactions  . Benazepril     States that it made his mouth burn  . Shellfish Allergy     HOME MEDICATIONS: Outpatient Medications Prior to Visit  Medication Sig Dispense Refill  . atorvastatin (LIPITOR) 20 MG tablet Take 1 tablet (20 mg total) by mouth daily. 30 tablet 3  . cholecalciferol (VITAMIN D3) 25 MCG (1000 UNIT) tablet Take 1,000 Units by mouth daily.    . divalproex (DEPAKOTE) 500 MG DR tablet 2 tablets in the morning, 3 in the evening 150 tablet 5  . glipiZIDE (GLUCOTROL XL) 5 MG 24 hr tablet Take 2 tablets (10 mg total) by mouth daily with breakfast. 60 tablet 3  . glucose blood test strip 1 each by Other route 2 (two) times daily. Use as instructed to test blood glucose two times daily. 200 each 3  . hydrochlorothiazide (HYDRODIURIL) 25 MG tablet Take 1 tablet by mouth once daily 90 tablet 0  . levETIRAcetam (KEPPRA) 750 MG tablet One tablet in the morning and 2 in the evening 270 tablet 1  . metFORMIN (GLUCOPHAGE) 1000 MG tablet Take 1 tablet (1,000 mg total) by mouth 2 (two) times daily with a meal.  180 tablet 3  . Siponimod Fumarate (MAYZENT) 2 MG TABS Take by mouth.    . Vitamin D, Ergocalciferol, (DRISDOL) 1.25 MG (50000 UNIT) CAPS capsule Take 1 capsule by mouth once a week 12 capsule 0  . Dimethyl Fumarate (TECFIDERA) 240 MG CPDR Take 1 capsule (240 mg total) by mouth 2 (two) times daily. 60 capsule 11   No facility-administered medications prior to visit.    PAST MEDICAL HISTORY: Past Medical History:  Diagnosis Date  . Diabetes (HCC)   . Fluid retention   . Hypertension   .  Multiple sclerosis (HCC) 12/29/2017  . Seizures (HCC)     PAST SURGICAL HISTORY: Past Surgical History:  Procedure Laterality Date  . MOUTH SURGERY     x3, jaw, hip, chin    FAMILY HISTORY: Family History  Problem Relation Age of Onset  . Diabetes Mother   . Pulmonary embolism Mother        hx of blood clots  . Diabetes Father     SOCIAL HISTORY: Social History   Socioeconomic History  . Marital status: Single    Spouse name: Not on file  . Number of children: 3  . Years of education: HS  . Highest education level: Not on file  Occupational History    Comment: Unemployed  Tobacco Use  . Smoking status: Never Smoker  . Smokeless tobacco: Never Used  Vaping Use  . Vaping Use: Never used  Substance and Sexual Activity  . Alcohol use: No    Comment: Quit: 2012  . Drug use: No  . Sexual activity: Not on file  Other Topics Concern  . Not on file  Social History Narrative   Pt lives at home with his spouse.   Caffeine Use: very little   Right handed    Social Determinants of Health   Financial Resource Strain:   . Difficulty of Paying Living Expenses: Not on file  Food Insecurity:   . Worried About Programme researcher, broadcasting/film/video in the Last Year: Not on file  . Ran Out of Food in the Last Year: Not on file  Transportation Needs:   . Lack of Transportation (Medical): Not on file  . Lack of Transportation (Non-Medical): Not on file  Physical Activity:   . Days of Exercise per Week: Not on file  . Minutes of Exercise per Session: Not on file  Stress:   . Feeling of Stress : Not on file  Social Connections:   . Frequency of Communication with Friends and Family: Not on file  . Frequency of Social Gatherings with Friends and Family: Not on file  . Attends Religious Services: Not on file  . Active Member of Clubs or Organizations: Not on file  . Attends Banker Meetings: Not on file  . Marital Status: Not on file  Intimate Partner Violence:   . Fear of  Current or Ex-Partner: Not on file  . Emotionally Abused: Not on file  . Physically Abused: Not on file  . Sexually Abused: Not on file   PHYSICAL EXAM  Vitals:   04/10/20 0754 04/10/20 0758  BP: (!) 151/101 (!) 152/96  Pulse: 92 90  Weight: 206 lb (93.4 kg)   Height: 5\' 7"  (1.702 m)    Body mass index is 32.26 kg/m.  Generalized: Well developed, in no acute distress   Neurological examination  Mentation: Alert oriented to time, place, history taking. Follows all commands speech and language fluent Cranial nerve II-XII: Pupils were  equal round reactive to light. Extraocular movements were full, visual field were full on confrontational test. Facial sensation and strength were normal.  Head turning and shoulder shrug  were normal and symmetric. Motor: The motor testing reveals 5 over 5 strength of all 4 extremities. Good symmetric motor tone is noted throughout.  Sensory: Sensory testing is intact to soft touch on all 4 extremities. No evidence of extinction is noted.  Coordination: Cerebellar testing reveals good finger-nose-finger and heel-to-shin bilaterally.  Gait and station: Gait is normal. Tandem gait is normal. Romberg is negative. No drift is seen.  Reflexes: Deep tendon reflexes are symmetric and normal bilaterally.   DIAGNOSTIC DATA (LABS, IMAGING, TESTING) - I reviewed patient records, labs, notes, testing and imaging myself where available.  Lab Results  Component Value Date   WBC 6.3 09/15/2019   HGB 13.2 09/15/2019   HCT 40.4 09/15/2019   MCV 82 09/15/2019   PLT 270 09/15/2019      Component Value Date/Time   NA 138 12/21/2019 0830   NA 139 09/15/2019 1123   K 4.1 12/21/2019 0830   CL 101 12/21/2019 0830   CO2 27 12/21/2019 0830   GLUCOSE 210 (H) 12/21/2019 0830   BUN 12 12/21/2019 0830   BUN 9 09/15/2019 1123   CREATININE 0.90 12/21/2019 0830   CALCIUM 9.8 12/21/2019 0830   PROT 7.6 12/21/2019 0830   PROT 7.5 09/15/2019 1123   ALBUMIN 4.5 09/15/2019  1123   AST 19 12/21/2019 0830   ALT 20 12/21/2019 0830   ALKPHOS 46 09/15/2019 1123   BILITOT 0.5 12/21/2019 0830   BILITOT 0.3 09/15/2019 1123   GFRNONAA 110 12/21/2019 0830   GFRAA 128 12/21/2019 0830   Lab Results  Component Value Date   CHOL 232 (H) 12/21/2019   HDL 63 12/21/2019   LDLCALC 143 (H) 12/21/2019   TRIG 135 12/21/2019   CHOLHDL 3.7 12/21/2019   Lab Results  Component Value Date   HGBA1C 8.2 (A) 03/26/2020   No results found for: VITAMINB12 Lab Results  Component Value Date   TSH 1.74 12/21/2019    ASSESSMENT AND PLAN 34 y.o. year old male  has a past medical history of Diabetes (HCC), Fluid retention, Hypertension, Multiple sclerosis (HCC) (12/29/2017), and Seizures (HCC). here with:  1.  Multiple sclerosis, relapsing remitting -Has been on Mayzent since early August, so far tolerating well -MRI of the brain in February 2020, showed new left brainstem lesion -Check routine blood work today -Will try gabapentin 300 mg at bedtime due to achy pain in his legs at night, preventing him from sleeping -We will recheck MRI of the brain at next visit  2.  Intractable seizures -Will check Keppra, Depakote levels -May increase as levels allow, 1 seizure this month, 3 in July, fewer seizures with medication adjustment in in February -Currently taking Keppra 750 mg, 1 in the morning, 2 in the evening -Depakote 500 mg, 2 in the morning, 3 in the evening -Follow-up in 5 months or sooner if needed  I spent 30 minutes of face-to-face and non-face-to-face time with patient.  This included previsit chart review, lab review, study review, order entry, electronic health record documentation, patient education.  Margie Ege, AGNP-C, DNP 04/10/2020, 8:29 AM [facility] [address] [place], [place] [address] [phone]

## 2020-04-10 NOTE — Progress Notes (Signed)
I have read the note, and I agree with the clinical assessment and plan.  Lavonia Eager K Tashiana Lamarca   

## 2020-04-12 ENCOUNTER — Telehealth: Payer: Self-pay | Admitting: Neurology

## 2020-04-12 DIAGNOSIS — R569 Unspecified convulsions: Secondary | ICD-10-CM

## 2020-04-12 LAB — COMPREHENSIVE METABOLIC PANEL
ALT: 15 IU/L (ref 0–44)
AST: 14 IU/L (ref 0–40)
Albumin/Globulin Ratio: 1.5 (ref 1.2–2.2)
Albumin: 4.5 g/dL (ref 4.0–5.0)
Alkaline Phosphatase: 40 IU/L — ABNORMAL LOW (ref 48–121)
BUN/Creatinine Ratio: 14 (ref 9–20)
BUN: 15 mg/dL (ref 6–20)
Bilirubin Total: 0.3 mg/dL (ref 0.0–1.2)
CO2: 25 mmol/L (ref 20–29)
Calcium: 10.3 mg/dL — ABNORMAL HIGH (ref 8.7–10.2)
Chloride: 97 mmol/L (ref 96–106)
Creatinine, Ser: 1.05 mg/dL (ref 0.76–1.27)
GFR calc Af Amer: 105 mL/min/{1.73_m2} (ref >59)
GFR calc non Af Amer: 91 mL/min/{1.73_m2} (ref >59)
Globulin, Total: 3 g/dL (ref 1.5–4.5)
Glucose: 166 mg/dL — ABNORMAL HIGH (ref 65–99)
Potassium: 3.6 mmol/L (ref 3.5–5.2)
Sodium: 141 mmol/L (ref 134–144)
Total Protein: 7.5 g/dL (ref 6.0–8.5)

## 2020-04-12 LAB — CBC WITH DIFFERENTIAL/PLATELET
Basophils Absolute: 0 10*3/uL (ref 0.0–0.2)
Basos: 0 %
EOS (ABSOLUTE): 0 10*3/uL (ref 0.0–0.4)
Eos: 1 %
Hematocrit: 45.4 % (ref 37.5–51.0)
Hemoglobin: 14.8 g/dL (ref 13.0–17.7)
Immature Grans (Abs): 0 10*3/uL (ref 0.0–0.1)
Immature Granulocytes: 1 %
Lymphocytes Absolute: 0.3 10*3/uL — ABNORMAL LOW (ref 0.7–3.1)
Lymphs: 7 %
MCH: 27 pg (ref 26.6–33.0)
MCHC: 32.6 g/dL (ref 31.5–35.7)
MCV: 83 fL (ref 79–97)
Monocytes Absolute: 0.6 10*3/uL (ref 0.1–0.9)
Monocytes: 16 %
Neutrophils Absolute: 2.6 10*3/uL (ref 1.4–7.0)
Neutrophils: 75 %
Platelets: 205 10*3/uL (ref 150–450)
RBC: 5.48 x10E6/uL (ref 4.14–5.80)
RDW: 13.6 % (ref 11.6–15.4)
WBC: 3.5 10*3/uL (ref 3.4–10.8)

## 2020-04-12 LAB — LEVETIRACETAM LEVEL: Levetiracetam Lvl: 36.7 ug/mL (ref 10.0–40.0)

## 2020-04-12 LAB — VALPROIC ACID LEVEL: Valproic Acid Lvl: 114 ug/mL — ABNORMAL HIGH (ref 50–100)

## 2020-04-12 MED ORDER — LEVETIRACETAM 750 MG PO TABS: ORAL_TABLET | ORAL | 1 refills | Status: DC

## 2020-04-12 NOTE — Telephone Encounter (Signed)
Sent Em ail to Angie so She can schedule with Dr. Melynda Ripple

## 2020-04-12 NOTE — Telephone Encounter (Signed)
Please call patient.  Please increase Keppra 750 mg, 2 tablets twice a day.  Continue Depakote at current dose.  Looks like, patient was referred to [facility] back in February for 72-hour EEG, does not look like this was completed.  Verifiy this with patient, if he is willing, I would like to reorder this, will see if Dr. Melynda Ripple can do this at [facility] (let me know so I can order please)   Absolute lymphocyte count is 0.3, okay on Mayzent.  Random glucose was 166.  Depakote level was 114, Keppra level was 36.7, high end of normal, will increase dosing slightly 1500 mg twice daily at max dosing. Call if intolerance at higher dose.  I sent new script for higher dosing Keppra.

## 2020-04-12 NOTE — Telephone Encounter (Signed)
I will order 72 hr EEG with Dr. Melynda Ripple.

## 2020-04-12 NOTE — Addendum Note (Signed)
Addended by: Glean Salvo on: 04/12/2020 09:08 AM   Modules accepted: Orders

## 2020-04-12 NOTE — Telephone Encounter (Signed)
Spoke to pt relayed message from NP Pt verbalized understanding and will take keppra 750 mg, 2 tabs twice a day and Depakote at current dose.  Pt will call if he has intolerance to medication treatment.  Pt also states he was unaware of orders for 72-hr EEG He is willing to do this at [facility] if it is necessary.

## 2020-05-01 MED ORDER — MAYZENT 2 MG PO TABS: 1.0000 | ORAL_TABLET | Freq: Every day | ORAL | 11 refills | Status: DC

## 2020-05-01 NOTE — Telephone Encounter (Signed)
At 10:51 this morning pt left a voicemail asking if he is going to be able to get a refill on his Siponimod Fumarate (MAYZENT) 2 MG TABS, please call.

## 2020-05-01 NOTE — Telephone Encounter (Signed)
I called pt and the mayzent comes from [facility] [employer]  Placed order for years supply.

## 2020-05-01 NOTE — Addendum Note (Signed)
Addended by: Guy Begin on: 05/01/2020 05:09 PM   Modules accepted: Orders

## 2020-05-28 ENCOUNTER — Other Ambulatory Visit: Payer: Self-pay | Admitting: Neurology

## 2020-05-28 DIAGNOSIS — G35 Multiple sclerosis: Secondary | ICD-10-CM

## 2020-05-28 DIAGNOSIS — R569 Unspecified convulsions: Secondary | ICD-10-CM

## 2020-05-28 DIAGNOSIS — Z5181 Encounter for therapeutic drug level monitoring: Secondary | ICD-10-CM

## 2020-05-29 ENCOUNTER — Other Ambulatory Visit: Payer: Self-pay | Admitting: *Deleted

## 2020-05-29 ENCOUNTER — Other Ambulatory Visit: Payer: Self-pay | Admitting: Neurology

## 2020-05-29 DIAGNOSIS — Z5181 Encounter for therapeutic drug level monitoring: Secondary | ICD-10-CM

## 2020-05-29 DIAGNOSIS — G35 Multiple sclerosis: Secondary | ICD-10-CM

## 2020-05-29 DIAGNOSIS — R569 Unspecified convulsions: Secondary | ICD-10-CM

## 2020-05-29 MED ORDER — DIVALPROEX SODIUM 500 MG PO DR TAB: DELAYED_RELEASE_TABLET | ORAL | 5 refills | Status: DC

## 2020-05-29 NOTE — Telephone Encounter (Signed)
Received refill for divalproex from [facility]. I spoke to pharmacy and relayed pt last prescription sent to [facility].  I spoke to pt and he has been getting at [facility],  New prescriptions sent pt taking 1000mg  po am 1500mg  po am.

## 2020-06-07 ENCOUNTER — Other Ambulatory Visit: Payer: Self-pay | Admitting: "Endocrinology

## 2020-06-20 ENCOUNTER — Other Ambulatory Visit: Payer: Self-pay | Admitting: "Endocrinology

## 2020-06-26 ENCOUNTER — Ambulatory Visit: Payer: Medicaid Other | Admitting: Nurse Practitioner

## 2020-07-09 ENCOUNTER — Telehealth: Payer: Self-pay | Admitting: "Endocrinology

## 2020-07-09 ENCOUNTER — Other Ambulatory Visit: Payer: Self-pay

## 2020-07-09 DIAGNOSIS — E1165 Type 2 diabetes mellitus with hyperglycemia: Secondary | ICD-10-CM

## 2020-07-09 MED ORDER — METFORMIN HCL 1000 MG PO TABS: 1000.0000 mg | ORAL_TABLET | Freq: Two times a day (BID) | ORAL | 0 refills | Status: DC

## 2020-07-09 NOTE — Telephone Encounter (Signed)
Pt is calling and requesting a refill on metFORMIN (GLUCOPHAGE) 1000 MG tablet   [facility] [address], [place] - [address] Phone:  [phone]  Fax:  [phone]        Pt states he can not make an appt right now because he can not get his blood work done right now due to his wife work sch.

## 2020-07-20 ENCOUNTER — Other Ambulatory Visit: Payer: Self-pay | Admitting: "Endocrinology

## 2020-08-07 ENCOUNTER — Other Ambulatory Visit: Payer: Self-pay | Admitting: "Endocrinology

## 2020-09-10 ENCOUNTER — Telehealth: Payer: Self-pay | Admitting: Neurology

## 2020-09-10 ENCOUNTER — Ambulatory Visit: Payer: Medicaid Other | Admitting: Neurology

## 2020-09-10 ENCOUNTER — Encounter: Payer: Self-pay | Admitting: Neurology

## 2020-09-10 DIAGNOSIS — G35 Multiple sclerosis: Secondary | ICD-10-CM

## 2020-09-10 DIAGNOSIS — Z5181 Encounter for therapeutic drug level monitoring: Secondary | ICD-10-CM

## 2020-09-10 DIAGNOSIS — R569 Unspecified convulsions: Secondary | ICD-10-CM

## 2020-09-10 MED ORDER — GABAPENTIN 600 MG PO TABS: 600.0000 mg | ORAL_TABLET | Freq: Every day | ORAL | 5 refills | Status: DC

## 2020-09-10 MED ORDER — DIVALPROEX SODIUM 500 MG PO DR TAB: DELAYED_RELEASE_TABLET | ORAL | 5 refills | Status: DC

## 2020-09-10 MED ORDER — LEVETIRACETAM 750 MG PO TABS: ORAL_TABLET | ORAL | 1 refills | Status: DC

## 2020-09-10 NOTE — Patient Instructions (Addendum)
Check MRI brain today  Check labs today  Continue current medications, increase gabapentin 600 mg at bedtime Recommend annual eye exam  See you back in 6 months Call me if you have any seizures

## 2020-09-10 NOTE — Telephone Encounter (Signed)
[facility]: [id number] (exp. 09/10/20 to 10/10/20) order faxed to [facility] bc that is who is in network with the pt plan they will reach out to the pt to schedule

## 2020-09-10 NOTE — Progress Notes (Signed)
PATIENT: Martin Stevenson DOB: 1986-07-22  REASON FOR VISIT: follow up HISTORY FROM: patient  HISTORY OF PRESENT ILLNESS: Today 09/10/20 [name] is a 35 year old male with history of multiple sclerosis and intractable seizures.    MS: Remains on Mayzent since August, was switched from Tecfidera as MRI in February 2021 showed new left brainstem lesion.  Remains overall stable. Does have intermittent achy pain to the right hand, left knee, mostly at night, affects his sleep.  On gabapentin 300 mg at bedtime, has seen some benefit, most nights, still wakes up a few hours after falling asleep.  Seizures: He is on Keppra and Depakote.  EEG has shown evidence of right frontotemporal abnormalities.    Keppra level 36.7, Depakote level 114 in August 2021, Keppra increased 1500 mg twice daily.  No seizures since last seen.  Previously, notices they are brought on during anxiety/stress.  Can feel 1 coming on, will sit down, have impaired consciousness, and behavioral manifestations (give middle finger, say curse words).  He has 7 kids, he does not drive, he is on disability.  Has diabetes, missed last labs for A1c.  Here today for evaluation unaccompanied.  HISTORY 04/10/2020 SS: Mr. Martin Stevenson is a 35 year old male with history of multiple sclerosis and intractable seizures. He is on Keppra and Depakote. EEG has shown evidence of right frontotemporal abnormalities. Has been on Mayzent since early August, was switched from Tecfidera as MRI in February showed new left brainstem lesion.  Is tolerating well.  MS overall stable.  Has some aching pain in his legs and hands at night, prevent him from sleeping.  No falls, no changes to balance, or vision.  Occasionally, may feel the right leg is weaker.  For seizures, one seizure last night, can feel it coming on, will sit down, will have impaired consciousness, and behavioral manifestations (give middle finger, say curse words).  He had 3 seizures in July.  In  February, his medications were adjusted, has had less seizure since.  He does not drive, he is on disability.  A1c has improved, 8.2.  No new problems or concerns.  Presents today for evaluation unaccompanied.   REVIEW OF SYSTEMS: Out of a complete 14 system review of symptoms, the patient complains only of the following symptoms, and all other reviewed systems are negative.  Seizures  ALLERGIES: Allergies  Allergen Reactions  . Benazepril     States that it made his mouth burn  . Shellfish Allergy     HOME MEDICATIONS: Outpatient Medications Prior to Visit  Medication Sig Dispense Refill  . atorvastatin (LIPITOR) 20 MG tablet Take 1 tablet by mouth once daily 30 tablet 2  . cholecalciferol (VITAMIN D3) 25 MCG (1000 UNIT) tablet Take 1,000 Units by mouth daily.    Marland Kitchen glipiZIDE (GLUCOTROL XL) 5 MG 24 hr tablet TAKE 2 TABLETS BY MOUTH ONCE DAILY WITH BREAKFAST 60 tablet 0  . glucose blood test strip 1 each by Other route 2 (two) times daily. Use as instructed to test blood glucose two times daily. 200 each 3  . hydrochlorothiazide (HYDRODIURIL) 25 MG tablet Take 1 tablet by mouth once daily 30 tablet 0  . metFORMIN (GLUCOPHAGE) 1000 MG tablet Take 1 tablet (1,000 mg total) by mouth 2 (two) times daily with a meal. 180 tablet 0  . Siponimod Fumarate (MAYZENT) 2 MG TABS Take 1 tablet by mouth daily. 30 tablet 11  . Vitamin D, Ergocalciferol, (DRISDOL) 1.25 MG (50000 UNIT) CAPS capsule Take 1 capsule by  mouth once a week 12 capsule 0  . divalproex (DEPAKOTE) 500 MG DR tablet 2 tablets in the morning, 3 in the evening 150 tablet 5  . gabapentin (NEURONTIN) 300 MG capsule Take 1 capsule (300 mg total) by mouth at bedtime. 30 capsule 5  . levETIRAcetam (KEPPRA) 750 MG tablet Take 2 tablets twice daily 360 tablet 1   No facility-administered medications prior to visit.    PAST MEDICAL HISTORY: Past Medical History:  Diagnosis Date  . Diabetes (HCC)   . Fluid retention   . Hypertension   .  Multiple sclerosis (HCC) 12/29/2017  . Seizures (HCC)     PAST SURGICAL HISTORY: Past Surgical History:  Procedure Laterality Date  . MOUTH SURGERY     x3, jaw, hip, chin    FAMILY HISTORY: Family History  Problem Relation Age of Onset  . Diabetes Mother   . Pulmonary embolism Mother        hx of blood clots  . Diabetes Father     SOCIAL HISTORY: Social History   Socioeconomic History  . Marital status: Single    Spouse name: Not on file  . Number of children: 3  . Years of education: HS  . Highest education level: Not on file  Occupational History    Comment: Unemployed  Tobacco Use  . Smoking status: Never Smoker  . Smokeless tobacco: Never Used  Vaping Use  . Vaping Use: Never used  Substance and Sexual Activity  . Alcohol use: No    Comment: Quit: 2012  . Drug use: No  . Sexual activity: Not on file  Other Topics Concern  . Not on file  Social History Narrative   Pt lives at home with his spouse.   Caffeine Use: very little   Right handed    Social Determinants of Health   Financial Resource Strain: Not on file  Food Insecurity: Not on file  Transportation Needs: Not on file  Physical Activity: Not on file  Stress: Not on file  Social Connections: Not on file  Intimate Partner Violence: Not on file   PHYSICAL EXAM  Vitals:   09/10/20 0938  BP: 133/90  Pulse: 70  Weight: 209 lb (94.8 kg)  Height: 5\' 7"  (1.702 m)   Body mass index is 32.73 kg/m.  Generalized: Well developed, in no acute distress   Neurological examination  Mentation: Alert oriented to time, place, history taking. Follows all commands speech and language fluent Cranial nerve II-XII: Pupils were equal round reactive to light. Extraocular movements were full, visual field were full on confrontational test. Facial sensation and strength were normal.  Head turning and shoulder shrug were normal and symmetric. Motor: The motor testing reveals 5 over 5 strength of all 4  extremities. Good symmetric motor tone is noted throughout.  Sensory: Sensory testing is intact to soft touch on all 4 extremities. No evidence of extinction is noted.  Coordination: Cerebellar testing reveals good finger-nose-finger and heel-to-shin bilaterally.  Gait and station: Gait is normal. Tandem gait is normal. Romberg is negative. No drift is seen.  Reflexes: Deep tendon reflexes are symmetric and normal bilaterally.   DIAGNOSTIC DATA (LABS, IMAGING, TESTING) - I reviewed patient records, labs, notes, testing and imaging myself where available.  Lab Results  Component Value Date   WBC 3.5 04/10/2020   HGB 14.8 04/10/2020   HCT 45.4 04/10/2020   MCV 83 04/10/2020   PLT 205 04/10/2020      Component Value Date/Time  NA 141 04/10/2020 0828   K 3.6 04/10/2020 0828   CL 97 04/10/2020 0828   CO2 25 04/10/2020 0828   GLUCOSE 166 (H) 04/10/2020 0828   GLUCOSE 210 (H) 12/21/2019 0830   BUN 15 04/10/2020 0828   CREATININE 1.05 04/10/2020 0828   CREATININE 0.90 12/21/2019 0830   CALCIUM 10.3 (H) 04/10/2020 0828   PROT 7.5 04/10/2020 0828   ALBUMIN 4.5 04/10/2020 0828   AST 14 04/10/2020 0828   ALT 15 04/10/2020 0828   ALKPHOS 40 (L) 04/10/2020 0828   BILITOT 0.3 04/10/2020 0828   GFRNONAA 91 04/10/2020 0828   GFRNONAA 110 12/21/2019 0830   GFRAA 105 04/10/2020 0828   GFRAA 128 12/21/2019 0830   Lab Results  Component Value Date   CHOL 232 (H) 12/21/2019   HDL 63 12/21/2019   LDLCALC 143 (H) 12/21/2019   TRIG 135 12/21/2019   CHOLHDL 3.7 12/21/2019   Lab Results  Component Value Date   HGBA1C 8.2 (A) 03/26/2020   No results found for: VITAMINB12 Lab Results  Component Value Date   TSH 1.74 12/21/2019   ASSESSMENT AND PLAN 35 y.o. year old male  has a past medical history of Diabetes (HCC), Fluid retention, Hypertension, Multiple sclerosis (HCC) (12/29/2017), and Seizures (HCC). here with:  1.  Relapsing remitting multiple sclerosis -On Mayzent since early  August 2021, tolerating well -MRI of the brain February 2021 showed new left brainstem lesion (on Tecfidera at the time) -Will check MRI of the brain with and without contrast for surveillance -Increase gabapentin 600 mg at bedtime, for achy pain at night in the left knee, right hand -Check routine labs -Recommend annual eye exam on Mayzent  2.  Intractable seizures -No seizures in the last 6 months -Continue Keppra 750 mg tablet, 2 tablets twice a day -Continue Depakote 500 mg, 2 tablets in the morning, 3 tablets in the evening -Check labs today -Call for seizure activity, previously we have discussed prolonged EEG, potential candidate for epilepsy surgery, would try to send to Dr. Melynda Ripple if needed, isn't sure interested in surgery, will hold off for now since seizures well controlled -Follow-up 6 months or sooner if needed  I spent 30 minutes of face-to-face and non-face-to-face time with patient.  This included previsit chart review, lab review, study review, order entry, electronic health record documentation, patient education.  Margie Ege, AGNP-C, DNP 09/10/2020, 10:16 AM [facility] [address] [place], [place] [address] [phone]

## 2020-09-10 NOTE — Progress Notes (Signed)
I have read the note, and I agree with the clinical assessment and plan.  Charles K Willis   

## 2020-09-12 ENCOUNTER — Telehealth: Payer: Self-pay

## 2020-09-12 ENCOUNTER — Other Ambulatory Visit: Payer: Self-pay | Admitting: "Endocrinology

## 2020-09-12 LAB — COMPREHENSIVE METABOLIC PANEL
ALT: 17 IU/L (ref 0–44)
AST: 16 IU/L (ref 0–40)
Albumin/Globulin Ratio: 1.3 (ref 1.2–2.2)
Albumin: 4 g/dL (ref 4.0–5.0)
Alkaline Phosphatase: 44 IU/L (ref 44–121)
BUN/Creatinine Ratio: 12 (ref 9–20)
BUN: 11 mg/dL (ref 6–20)
Bilirubin Total: 0.4 mg/dL (ref 0.0–1.2)
CO2: 23 mmol/L (ref 20–29)
Calcium: 9.5 mg/dL (ref 8.7–10.2)
Chloride: 105 mmol/L (ref 96–106)
Creatinine, Ser: 0.91 mg/dL (ref 0.76–1.27)
GFR calc Af Amer: 125 mL/min/{1.73_m2} (ref >59)
GFR calc non Af Amer: 108 mL/min/{1.73_m2} (ref >59)
Globulin, Total: 3 g/dL (ref 1.5–4.5)
Glucose: 133 mg/dL — ABNORMAL HIGH (ref 65–99)
Potassium: 4.5 mmol/L (ref 3.5–5.2)
Sodium: 144 mmol/L (ref 134–144)
Total Protein: 7 g/dL (ref 6.0–8.5)

## 2020-09-12 LAB — CBC WITH DIFFERENTIAL/PLATELET
Basophils Absolute: 0 10*3/uL (ref 0.0–0.2)
Basos: 0 %
EOS (ABSOLUTE): 0.1 10*3/uL (ref 0.0–0.4)
Eos: 1 %
Hematocrit: 40.7 % (ref 37.5–51.0)
Hemoglobin: 13.5 g/dL (ref 13.0–17.7)
Immature Grans (Abs): 0 10*3/uL (ref 0.0–0.1)
Immature Granulocytes: 1 %
Lymphocytes Absolute: 0.3 10*3/uL — ABNORMAL LOW (ref 0.7–3.1)
Lymphs: 8 %
MCH: 27.5 pg (ref 26.6–33.0)
MCHC: 33.2 g/dL (ref 31.5–35.7)
MCV: 83 fL (ref 79–97)
Monocytes Absolute: 0.6 10*3/uL (ref 0.1–0.9)
Monocytes: 14 %
Neutrophils Absolute: 3.2 10*3/uL (ref 1.4–7.0)
Neutrophils: 76 %
Platelets: 211 10*3/uL (ref 150–450)
RBC: 4.91 x10E6/uL (ref 4.14–5.80)
RDW: 14.4 % (ref 11.6–15.4)
WBC: 4.2 10*3/uL (ref 3.4–10.8)

## 2020-09-12 LAB — LEVETIRACETAM LEVEL: Levetiracetam Lvl: 31.5 ug/mL (ref 10.0–40.0)

## 2020-09-12 LAB — VALPROIC ACID LEVEL: Valproic Acid Lvl: 114 ug/mL — ABNORMAL HIGH (ref 50–100)

## 2020-09-12 NOTE — Telephone Encounter (Signed)
-----   Message from Glean Salvo, NP sent at 09/12/2020  1:35 PM EST ----- CBC shows absolute lymphocyte count 0.3, this is okay on Mayzent, will closely follow.  Random glucose was elevated 133, otherwise no abnormalities in CMP.  Depakote level slightly elevated 114.  Keppra level is within range.  Will not alter seizure medications.  No seizure in the last 6 months.

## 2020-09-12 NOTE — Telephone Encounter (Signed)
Pt verified by name and DOB, results given per provider, pt voiced understanding all question answered. 

## 2020-09-17 NOTE — Telephone Encounter (Signed)
schedule for 2/21 11am

## 2020-09-24 ENCOUNTER — Other Ambulatory Visit: Payer: Self-pay | Admitting: "Endocrinology

## 2020-10-03 ENCOUNTER — Telehealth: Payer: Self-pay | Admitting: Neurology

## 2020-10-03 NOTE — Telephone Encounter (Signed)
Recent MRI of the brain, seems stable based on report, but can we get a disc for Dr. Anne Hahn to review since was done at [facility] to compare to previous.   MRI of the brain with and without contrast 10/01/20 1. Several small areas of T2/FLAIR-hyperintense signal in the bilateral cerebral white matter, predominantly periventricular. The 2 largest lesions are in the bilateral peritrigonal white matter - one on each side. There is also a small T2/FLAIR-hyperintense lesion in the left inferior cerebellar peduncle. These are strictly nonspecific, but compatible with the stated clinical diagnosis of multiple sclerosis. Most, if not all, of the lesions are also T1-hypointense, indicating chronicity. No abnormal diffusion restriction or enhancement to indicate active demyelination.   2. Mild cerebellar volume loss, nonspecific.   MRI of the brain with and without contrast 10/04/2019  IMPRESSION: Abnormal MRI scan of the brain showing bilateral periatrial, subcortical as well as left cerebral peduncle white matter hyperintensities compatible with chronic demyelinating disease.  No enhancing lesions are noted.  Compared with previous MRI from 10/14/2017 the left cerebellar peduncle lesion appears new.

## 2020-10-08 ENCOUNTER — Telehealth: Payer: Self-pay | Admitting: Neurology

## 2020-10-08 MED ORDER — MAYZENT 2 MG PO TABS: 1.0000 | ORAL_TABLET | Freq: Every day | ORAL | 11 refills | Status: DC

## 2020-10-08 NOTE — Telephone Encounter (Signed)
Pt is requesting a refill for Siponimod Fumarate (MAYZENT) 2 MG TABS.  Pharmacy:  [facility]

## 2020-10-08 NOTE — Telephone Encounter (Signed)
Pt is up to date on appt and Lymphocyte count was 0.3 on 09/10/20. Refill has been sent to the requested pharmacy.

## 2020-10-08 NOTE — Addendum Note (Signed)
Addended by: Ann Maki on: 10/08/2020 01:16 PM   Modules accepted: Orders

## 2020-10-11 ENCOUNTER — Telehealth: Payer: Self-pay | Admitting: Neurology

## 2020-10-11 NOTE — Telephone Encounter (Signed)
Dr. Anne Hahn was able to review CD of recent MRI with previous in Feb 2021. Looks overall stable, no definite new lesions.   MRI of the brain with and without contrast 10/01/20 IMPRESSION:   1. Several small areas of T2/FLAIR-hyperintense signal in the bilateral cerebral white matter, predominantly periventricular. The 2 largest lesions are in the bilateral peritrigonal white matter - one on each side. There is also a small T2/FLAIR-hyperintense lesion in the left inferior cerebellar peduncle. These are strictly nonspecific, but compatible with the stated clinical diagnosis of multiple sclerosis. Most, if not all, of the lesions are also T1-hypointense, indicating chronicity. No abnormal diffusion restriction or enhancement to indicate active demyelination.   2. Mild cerebellar volume loss, nonspecific.

## 2020-10-15 NOTE — Telephone Encounter (Signed)
2nd attempt to call pt Mailbox is still full

## 2020-10-16 ENCOUNTER — Telehealth: Payer: Self-pay | Admitting: Neurology

## 2020-10-16 NOTE — Telephone Encounter (Signed)
Pt called stating that his Siponimod Fumarate (MAYZENT) 2 MG TABS needs to be called in to the [facility] Please advise.

## 2020-10-16 NOTE — Telephone Encounter (Signed)
Spoke to pt.  He verified his address. Change from last time.  I relayed that new prescription was sent in 10-08-20 for one year.  He will need to call and make sure about his address and need for refill. He appreciated call and will call back if needed.

## 2020-12-19 NOTE — Telephone Encounter (Signed)
error 

## 2021-01-15 ENCOUNTER — Telehealth: Payer: Self-pay | Admitting: *Deleted

## 2021-01-15 NOTE — Telephone Encounter (Signed)
Completed [employer] P[employer] form for Mayzent.  To SS/NP for signature.

## 2021-01-16 NOTE — Telephone Encounter (Signed)
Signed application faxed to [employer] PAP with fax confirmation.

## 2021-02-08 LAB — HEMOGLOBIN A1C: Hemoglobin A1C: 9

## 2021-02-08 LAB — BASIC METABOLIC PANEL
BUN: 14 (ref 4–21)
CO2: 26 — AB (ref 13–22)
Chloride: 105 (ref 99–108)
Creatinine: 0.9 (ref 0.6–1.3)
Potassium: 4.4 (ref 3.4–5.3)
Sodium: 140 (ref 137–147)

## 2021-02-08 LAB — HEPATIC FUNCTION PANEL
ALT: 11 (ref 10–40)
AST: 12 — AB (ref 14–40)
Alkaline Phosphatase: 36 (ref 25–125)
Bilirubin, Total: 0.5

## 2021-02-08 LAB — COMPREHENSIVE METABOLIC PANEL
Albumin: 4.1 (ref 3.5–5.0)
Calcium: 8.8 (ref 8.7–10.7)

## 2021-02-16 ENCOUNTER — Other Ambulatory Visit: Payer: Self-pay | Admitting: "Endocrinology

## 2021-02-21 ENCOUNTER — Other Ambulatory Visit: Payer: Self-pay | Admitting: Nurse Practitioner

## 2021-02-21 ENCOUNTER — Encounter: Payer: Self-pay | Admitting: "Endocrinology

## 2021-02-21 DIAGNOSIS — E1165 Type 2 diabetes mellitus with hyperglycemia: Secondary | ICD-10-CM

## 2021-02-21 DIAGNOSIS — E782 Mixed hyperlipidemia: Secondary | ICD-10-CM

## 2021-02-21 DIAGNOSIS — E559 Vitamin D deficiency, unspecified: Secondary | ICD-10-CM

## 2021-03-11 ENCOUNTER — Encounter: Payer: Self-pay | Admitting: Neurology

## 2021-03-11 ENCOUNTER — Other Ambulatory Visit: Payer: Self-pay

## 2021-03-11 ENCOUNTER — Ambulatory Visit: Payer: Medicaid Other | Admitting: Neurology

## 2021-03-11 VITALS — BP 126/76 | HR 88 | Ht 67.0 in | Wt 198.6 lb

## 2021-03-11 DIAGNOSIS — R569 Unspecified convulsions: Secondary | ICD-10-CM | POA: Diagnosis not present

## 2021-03-11 DIAGNOSIS — G35 Multiple sclerosis: Secondary | ICD-10-CM | POA: Diagnosis not present

## 2021-03-11 DIAGNOSIS — G8929 Other chronic pain: Secondary | ICD-10-CM

## 2021-03-11 DIAGNOSIS — Z5181 Encounter for therapeutic drug level monitoring: Secondary | ICD-10-CM

## 2021-03-11 DIAGNOSIS — M25562 Pain in left knee: Secondary | ICD-10-CM

## 2021-03-11 MED ORDER — LEVETIRACETAM 750 MG PO TABS: ORAL_TABLET | ORAL | 3 refills | Status: DC

## 2021-03-11 MED ORDER — DIVALPROEX SODIUM 500 MG PO DR TAB: DELAYED_RELEASE_TABLET | ORAL | 3 refills | Status: DC

## 2021-03-11 NOTE — Progress Notes (Signed)
Reason for visit: Multiple sclerosis, seizures  Martin Stevenson is an 35 y.o. male  History of present illness:  Martin Stevenson is a 35 year old right-handed black male with a history of multiple sclerosis and a history of intractable seizures.  The patient generally has several seizures a month, he has gone 6 weeks since his last seizure event.  He remains on Keppra and Depakote.  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  He has a history of diabetes as well.  He has done well with his MS symptoms, he has not had any new symptoms since last seen.  He remains on Mayzent.  His last MRI was done a year and a half ago and showed a new brainstem lesion.  He was switched over to Mayzent at that time.  The patient reports that he has left knee discomfort that has been present for least several months.  He has increased discomfort if he is sitting for a long period time and then tries to walk.  The knee may bother him at nighttime as well when he is trying to sleep.  His most recent hemoglobin A1c was 9.0.  He returns for further evaluation.  Past Medical History:  Diagnosis Date   Diabetes (HCC)    Fluid retention    Hypertension    Multiple sclerosis (HCC) 12/29/2017   Seizures (HCC)     Past Surgical History:  Procedure Laterality Date   MOUTH SURGERY     x3, jaw, hip, chin    Family History  Problem Relation Age of Onset   Diabetes Mother    Pulmonary embolism Mother        hx of blood clots   Diabetes Father     Social history:  reports that he has never smoked. He has never used smokeless tobacco. He reports that he does not drink alcohol and does not use drugs.    Allergies  Allergen Reactions   Benazepril     States that it made his mouth burn   Shellfish Allergy     Medications:  Prior to Admission medications   Medication Sig Start Date End Date Taking? Authorizing Provider  atorvastatin (LIPITOR) 20 MG tablet Take 1 tablet by mouth  once daily 06/07/20  Yes Nida, Denman George, MD  cholecalciferol (VITAMIN D3) 25 MCG (1000 UNIT) tablet Take 1,000 Units by mouth daily.   Yes [provider]  divalproex (DEPAKOTE) 500 MG DR tablet 2 tablets in the morning, 3 in the evening 09/10/20  Yes Glean Salvo, NP  gabapentin (NEURONTIN) 600 MG tablet Take 1 tablet (600 mg total) by mouth at bedtime. 09/10/20  Yes Glean Salvo, NP  glipiZIDE (GLUCOTROL XL) 5 MG 24 hr tablet TAKE 2 TABLETS BY MOUTH ONCE DAILY WITH BREAKFAST 08/07/20  Yes Nida, Denman George, MD  glucose blood test strip 1 each by Other route 2 (two) times daily. Use as instructed to test blood glucose two times daily. 02/20/20  Yes Roma Kayser, MD  hydrochlorothiazide (HYDRODIURIL) 25 MG tablet Take 1 tablet by mouth once daily 07/23/20  Yes Nida, Denman George, MD  levETIRAcetam (KEPPRA) 750 MG tablet Take 2 tablets twice daily 09/10/20  Yes Glean Salvo, NP  losartan-hydrochlorothiazide (HYZAAR) 100-25 MG tablet Take 1 tablet by mouth daily. 03/01/21  Yes [provider]  metFORMIN (GLUCOPHAGE) 1000 MG tablet Take 1 tablet (1,000 mg total) by mouth 2 (two) times daily with a  meal. 07/09/20  Yes Nida, Denman George, MD  Siponimod Fumarate (MAYZENT) 2 MG TABS Take 1 tablet by mouth daily. 10/08/20  Yes Glean Salvo, NP  Vitamin D, Ergocalciferol, (DRISDOL) 1.25 MG (50000 UNIT) CAPS capsule Take 1 capsule by mouth once a week 06/20/20  Yes Nida, Denman George, MD    ROS:  Out of a complete 14 system review of symptoms, the patient complains only of the following symptoms, and all other reviewed systems are negative.  Left knee pain Right hand numbness Seizures  Blood pressure 126/76, pulse 88, height 5\' 7"  (1.702 m), weight 198 lb 9.6 oz (90.1 kg).  Physical Exam  General: The patient is alert and cooperative at the time of the examination.  Skin: No significant peripheral edema is noted.   Neurologic Exam  Mental  status: The patient is alert and oriented x 3 at the time of the examination. The patient has apparent normal recent and remote memory, with an apparently normal attention span and concentration ability.   Cranial nerves: Facial symmetry is present. Speech is normal, no aphasia or dysarthria is noted. Extraocular movements are full. Visual fields are full.  Pupils are equal, round, and reactive to light.  Discs are flat bilaterally.  Motor: The patient has good strength in all 4 extremities.  Sensory examination: Soft touch sensation is symmetric on the face, arms, and legs.  Coordination: The patient has good finger-nose-finger and heel-to-shin bilaterally.  Gait and station: The patient has a normal gait. Tandem gait is normal. Romberg is negative. No drift is seen.  Reflexes: Deep tendon reflexes are symmetric.   Assessment/Plan:  1.  Multiple sclerosis  2.  Intractable seizures  3.  Left knee pain  The patient will remain on Mayzent for now, we will recheck MRI of the brain.  The patient will have blood work done today.  We will send him for an x-ray of the left knee and consider an orthopedic surgery referral.  He will follow-up here in 6 months.  He does not operate a motor vehicle, a letter was written so that he may get some assistance with transportation.  Prescriptions were sent in for the Keppra and for the Depakote.  MD 03/11/2021 10:58 AM  [facility] [address] [place], [place]  Phone [phone] Fax [phone]

## 2021-03-11 NOTE — Telephone Encounter (Signed)
Received fax asking for financial information and signature for his PAP, Mayzent.  Saw Dr. Anne Hahn today 03-11-21 pt was taking the medication.  I will mail this to pt.  Done.

## 2021-03-12 ENCOUNTER — Ambulatory Visit: Payer: Medicaid Other | Admitting: Nurse Practitioner

## 2021-03-12 ENCOUNTER — Other Ambulatory Visit: Payer: Self-pay

## 2021-03-12 DIAGNOSIS — E559 Vitamin D deficiency, unspecified: Secondary | ICD-10-CM

## 2021-03-12 DIAGNOSIS — I10 Essential (primary) hypertension: Secondary | ICD-10-CM

## 2021-03-12 DIAGNOSIS — E1165 Type 2 diabetes mellitus with hyperglycemia: Secondary | ICD-10-CM

## 2021-03-12 DIAGNOSIS — E782 Mixed hyperlipidemia: Secondary | ICD-10-CM

## 2021-03-13 LAB — LIPID PANEL
Cholesterol: 225 mg/dL — ABNORMAL HIGH (ref <200)
HDL: 44 mg/dL (ref >=40)
LDL Cholesterol (Calc): 137 mg/dL (calc) — ABNORMAL HIGH
Non-HDL Cholesterol (Calc): 181 mg/dL (calc) — ABNORMAL HIGH (ref <130)
Total CHOL/HDL Ratio: 5.1 (calc) — ABNORMAL HIGH (ref <5.0)
Triglycerides: 307 mg/dL — ABNORMAL HIGH (ref <150)

## 2021-03-13 LAB — COMPREHENSIVE METABOLIC PANEL
AG Ratio: 1.7 (calc) (ref 1.0–2.5)
ALT: 16 U/L (ref 9–46)
AST: 13 U/L (ref 10–40)
Albumin: 4.5 g/dL (ref 3.6–5.1)
Alkaline phosphatase (APISO): 47 U/L (ref 36–130)
BUN: 14 mg/dL (ref 7–25)
CO2: 30 mmol/L (ref 20–32)
Calcium: 9.9 mg/dL (ref 8.6–10.3)
Chloride: 98 mmol/L (ref 98–110)
Creat: 0.89 mg/dL (ref 0.60–1.26)
Globulin: 2.6 g/dL (calc) (ref 1.9–3.7)
Glucose, Bld: 299 mg/dL — ABNORMAL HIGH (ref 65–139)
Potassium: 4 mmol/L (ref 3.5–5.3)
Sodium: 136 mmol/L (ref 135–146)
Total Bilirubin: 0.5 mg/dL (ref 0.2–1.2)
Total Protein: 7.1 g/dL (ref 6.1–8.1)

## 2021-03-13 LAB — VITAMIN D 25 HYDROXY (VIT D DEFICIENCY, FRACTURES): Vit D, 25-Hydroxy: 27 ng/mL — ABNORMAL LOW (ref 30–100)

## 2021-03-13 LAB — T4, FREE: Free T4: 1.4 ng/dL (ref 0.8–1.8)

## 2021-03-13 LAB — TSH: TSH: 2.38 mIU/L (ref 0.40–4.50)

## 2021-03-14 ENCOUNTER — Ambulatory Visit: Payer: Medicaid Other | Admitting: Nurse Practitioner

## 2021-03-14 ENCOUNTER — Telehealth: Payer: Self-pay

## 2021-03-14 LAB — CBC WITH DIFFERENTIAL/PLATELET
Basophils Absolute: 0 10*3/uL (ref 0.0–0.2)
Basos: 0 %
EOS (ABSOLUTE): 0 10*3/uL (ref 0.0–0.4)
Eos: 1 %
Hematocrit: 45.2 % (ref 37.5–51.0)
Hemoglobin: 14.4 g/dL (ref 13.0–17.7)
Immature Grans (Abs): 0 10*3/uL (ref 0.0–0.1)
Immature Granulocytes: 0 %
Lymphocytes Absolute: 0.3 10*3/uL — ABNORMAL LOW (ref 0.7–3.1)
Lymphs: 15 %
MCH: 28.3 pg (ref 26.6–33.0)
MCHC: 31.9 g/dL (ref 31.5–35.7)
MCV: 89 fL (ref 79–97)
Monocytes Absolute: 0.4 10*3/uL (ref 0.1–0.9)
Monocytes: 16 %
Neutrophils Absolute: 1.5 10*3/uL (ref 1.4–7.0)
Neutrophils: 68 %
Platelets: 188 10*3/uL (ref 150–450)
RBC: 5.09 x10E6/uL (ref 4.14–5.80)
RDW: 13 % (ref 11.6–15.4)
WBC: 2.3 10*3/uL — CL (ref 3.4–10.8)

## 2021-03-14 LAB — VALPROIC ACID LEVEL: Valproic Acid Lvl: 145 ug/mL (ref 50–100)

## 2021-03-14 LAB — LEVETIRACETAM LEVEL: Levetiracetam Lvl: 41.8 ug/mL — ABNORMAL HIGH (ref 10.0–40.0)

## 2021-03-14 NOTE — Telephone Encounter (Signed)
-----   Message from York Spaniel, MD sent at 03/14/2021  6:56 AM EDT ----- Blood work reveals a low white blood count on Mayzent, no concerns, absolute lymphocyte count of 0.3 which is adequate on Mayzent.  Random levels of Keppra and Depakote are adequate, no change in medical therapy is recommended. Please call the patient. ----- Message ----- From: Nell Range Lab Results In Sent: 03/12/2021   7:37 AM EDT To: York Spaniel, MD

## 2021-03-14 NOTE — Telephone Encounter (Signed)
I called the pt and advised of results. Pt verbalized understanding and will keep f/u as scheduled for February of 2023.

## 2021-03-18 ENCOUNTER — Encounter: Payer: Self-pay | Admitting: Nurse Practitioner

## 2021-03-18 ENCOUNTER — Ambulatory Visit (INDEPENDENT_AMBULATORY_CARE_PROVIDER_SITE_OTHER): Payer: Medicaid Other | Admitting: Nurse Practitioner

## 2021-03-18 VITALS — BP 139/82 | HR 78 | Ht 67.0 in | Wt 195.0 lb

## 2021-03-18 DIAGNOSIS — I10 Essential (primary) hypertension: Secondary | ICD-10-CM

## 2021-03-18 DIAGNOSIS — E782 Mixed hyperlipidemia: Secondary | ICD-10-CM | POA: Diagnosis not present

## 2021-03-18 DIAGNOSIS — E559 Vitamin D deficiency, unspecified: Secondary | ICD-10-CM | POA: Diagnosis not present

## 2021-03-18 DIAGNOSIS — E1165 Type 2 diabetes mellitus with hyperglycemia: Secondary | ICD-10-CM

## 2021-03-18 MED ORDER — ATORVASTATIN CALCIUM 20 MG PO TABS: 20.0000 mg | ORAL_TABLET | Freq: Every day | ORAL | 3 refills | Status: DC

## 2021-03-18 MED ORDER — METFORMIN HCL 1000 MG PO TABS: 1000.0000 mg | ORAL_TABLET | Freq: Two times a day (BID) | ORAL | 3 refills | Status: DC

## 2021-03-18 MED ORDER — LOSARTAN POTASSIUM-HCTZ 100-25 MG PO TABS: 1.0000 | ORAL_TABLET | Freq: Every day | ORAL | 3 refills | Status: DC

## 2021-03-18 NOTE — Progress Notes (Signed)
03/18/2021, 11:33 AM   Endocrinology follow-up note   Subjective:    Patient ID: Martin Stevenson, male    DOB: [birth year].  Martin Stevenson is being seen in follow-up  for management of currently uncontrolled symptomatic diabetes, hyperlipidemia, hypertension.  PMD:  Avon Gully, MD.   Past Medical History:  Diagnosis Date   Diabetes [facility])    Fluid retention    Hypertension    Multiple sclerosis (HCC) 12/29/2017   Seizures (HCC)    Past Surgical History:  Procedure Laterality Date   MOUTH SURGERY     x3, jaw, hip, chin   Social History   Socioeconomic History   Marital status: Single    Spouse name: Not on file   Number of children: 3   Years of education: HS   Highest education level: Not on file  Occupational History    Comment: Unemployed  Tobacco Use   Smoking status: Never   Smokeless tobacco: Never  Vaping Use   Vaping Use: Never used  Substance and Sexual Activity   Alcohol use: No    Comment: Quit: 2012   Drug use: No   Sexual activity: Not on file  Other Topics Concern   Not on file  Social History Narrative   Pt lives at home with his spouse.   Caffeine Use: very little   Right handed    Social Determinants of Health   Financial Resource Strain: Not on file  Food Insecurity: Not on file  Transportation Needs: Not on file  Physical Activity: Not on file  Stress: Not on file  Social Connections: Not on file   Outpatient Encounter Medications as of 03/18/2021  Medication Sig   atorvastatin (LIPITOR) 20 MG tablet Take 1 tablet (20 mg total) by mouth daily.   cholecalciferol (VITAMIN D3) 25 MCG (1000 UNIT) tablet Take 1,000 Units by mouth daily.   divalproex (DEPAKOTE) 500 MG DR tablet 2 tablets in the morning, 3 in the evening   gabapentin (NEURONTIN) 600 MG tablet Take 1 tablet (600 mg total) by mouth at bedtime.   glipiZIDE (GLUCOTROL XL) 5 MG 24 hr tablet TAKE 2  TABLETS BY MOUTH ONCE DAILY WITH BREAKFAST   glucose blood test strip 1 each by Other route 2 (two) times daily. Use as instructed to test blood glucose two times daily.   hydrochlorothiazide (HYDRODIURIL) 25 MG tablet Take 1 tablet by mouth once daily   levETIRAcetam (KEPPRA) 750 MG tablet Take 2 tablets twice daily   losartan-hydrochlorothiazide (HYZAAR) 100-25 MG tablet Take 1 tablet by mouth daily.   metFORMIN (GLUCOPHAGE) 1000 MG tablet Take 1 tablet (1,000 mg total) by mouth 2 (two) times daily with a meal.   Siponimod Fumarate (MAYZENT) 2 MG TABS Take 1 tablet by mouth daily.   Vitamin D, Ergocalciferol, (DRISDOL) 1.25 MG (50000 UNIT) CAPS capsule Take 1 capsule by mouth once a week   [DISCONTINUED] atorvastatin (LIPITOR) 20 MG tablet Take 1 tablet by mouth once daily   [DISCONTINUED] losartan-hydrochlorothiazide (HYZAAR) 100-25 MG tablet Take 1 tablet by mouth daily.   [DISCONTINUED] metFORMIN (GLUCOPHAGE) 1000 MG tablet Take 1 tablet (1,000 mg total) by mouth 2 (two) times  daily with a meal.   No facility-administered encounter medications on file as of 03/18/2021.    ALLERGIES: Allergies  Allergen Reactions   Benazepril     States that it made his mouth burn   Shellfish Allergy     VACCINATION STATUS:  There is no immunization history on file for this patient.  Diabetes He presents for his follow-up diabetic visit. He has type 2 diabetes mellitus. Onset time: He was diagnosed at approximate age of 34 years. His disease course has been worsening. There are no hypoglycemic associated symptoms. Pertinent negatives for hypoglycemia include no confusion, headaches, pallor or seizures. Associated symptoms include polydipsia and polyuria. Pertinent negatives for diabetes include no chest pain, no fatigue, no polyphagia and no weakness. There are no hypoglycemic complications. Symptoms are stable. There are no diabetic complications. Risk factors for coronary artery disease include  diabetes mellitus, male sex, obesity and sedentary lifestyle. Current diabetic treatments: Has been out of his medications for the past 4-5 months. His weight is fluctuating minimally. He is following a generally healthy diet. When asked about meal planning, he reported none. He has not had a previous visit with a dietitian. He participates in exercise intermittently. (He presents today, with no meter or logs to review.  His most recent A1c was 9% on 02/08/21.  He ran out his medications 4-5 months ago which has contributed to his decline.  He does typically check glucose once daily before breakfast, but his battery on his meter recently died.  ) An ACE inhibitor/angiotensin II receptor blocker is being taken. He does not see a podiatrist.Eye exam is current.  Hyperlipidemia This is a chronic problem. The current episode started more than 1 year ago. The problem is uncontrolled. Recent lipid tests were reviewed and are high. Exacerbating diseases include diabetes. Factors aggravating his hyperlipidemia include thiazides. Pertinent negatives include no chest pain. Current antihyperlipidemic treatment includes statins. The current treatment provides mild improvement of lipids. There are no compliance problems.  Risk factors for coronary artery disease include diabetes mellitus, dyslipidemia, male sex, obesity, a sedentary lifestyle and family history.  Hypertension This is a chronic problem. The current episode started more than 1 year ago. The problem is unchanged. The problem is controlled. Pertinent negatives include no chest pain or headaches. There are no associated agents to hypertension. Risk factors for coronary artery disease include diabetes mellitus, dyslipidemia, family history and male gender. Past treatments include angiotensin blockers and diuretics. The current treatment provides mild improvement. There are no compliance problems.     Review of systems  Constitutional: + Minimally fluctuating  body weight,  current Body mass index is 30.54 kg/m. , no fatigue, no subjective hyperthermia, no subjective hypothermia Eyes: no blurry vision, no xerophthalmia ENT: no sore throat, no nodules palpated in throat, no dysphagia/odynophagia, no hoarseness Cardiovascular: no chest pain, no shortness of breath, no palpitations, no leg swelling Respiratory: no cough, no shortness of breath Gastrointestinal: no nausea/vomiting/diarrhea Genitourinary: + polyuria Musculoskeletal: no muscle/joint aches Skin: no rashes, no hyperemia Neurological: no tremors, no numbness, no tingling, no dizziness Psychiatric: no depression, no anxiety   Objective:    BP 139/82   Pulse 78   Ht 5\' 7"  (1.702 m)   Wt 195 lb (88.5 kg)   BMI 30.54 kg/m   Wt Readings from Last 3 Encounters:  03/18/21 195 lb (88.5 kg)  03/11/21 198 lb 9.6 oz (90.1 kg)  09/10/20 209 lb (94.8 kg)    BP Readings from Last 3 Encounters:  03/18/21 139/82  03/11/21 126/76  09/10/20 133/90      Physical Exam- Limited  Constitutional:  Body mass index is 30.54 kg/m. , not in acute distress, normal state of mind Eyes:  EOMI, no exophthalmos Neck: Supple Cardiovascular: RRR, no murmurs, rubs, or gallops, no edema Respiratory: Adequate breathing efforts, no crackles, rales, rhonchi, or wheezing Musculoskeletal: no gross deformities, strength intact in all four extremities, no gross restriction of joint movements Skin:  no rashes, no hyperemia Neurological: no tremor with outstretched hands  Stevenson exam:  No rashes, ulcers, cuts, calluses, onychodystrophy.  Good pulses bilat. Good sensation to 10 g monofilament bilat.   POCT ABI Results 03/18/21   Right ABI:  1.30      Left ABI:  1.31  Right leg systolic / diastolic: 181/93 mmHg Left leg systolic / diastolic: 182/84 mmHg  Arm systolic / diastolic: 139/82 mmHG  Detailed report will be scanned into patient chart.    Recent Results (from the past 2160 hour(s))  Basic  metabolic panel     Status: Abnormal   Collection Time: 02/08/21 12:00 AM  Result Value Ref Range   BUN 14 4 - 21   CO2 26 (A) 13 - 22   Creatinine 0.9 0.6 - 1.3   Potassium 4.4 3.4 - 5.3   Sodium 140 137 - 147   Chloride 105 99 - 108  Comprehensive metabolic panel     Status: None   Collection Time: 02/08/21 12:00 AM  Result Value Ref Range   Calcium 8.8 8.7 - 10.7   Albumin 4.1 3.5 - 5.0  Hepatic function panel     Status: Abnormal   Collection Time: 02/08/21 12:00 AM  Result Value Ref Range   Alkaline Phosphatase 36 25 - 125   ALT 11 10 - 40   AST 12 (A) 14 - 40   Bilirubin, Total 0.5   Hemoglobin A1c     Status: None   Collection Time: 02/08/21 12:00 AM  Result Value Ref Range   Hemoglobin A1C 9.0   CBC with Differential/Platelet     Status: Abnormal   Collection Time: 03/11/21 11:33 AM  Result Value Ref Range   WBC 2.3 (LL) 3.4 - 10.8 x10E3/uL   RBC 5.09 4.14 - 5.80 x10E6/uL   Hemoglobin 14.4 13.0 - 17.7 g/dL   Hematocrit 03.4 74.2 - 51.0 %   MCV 89 79 - 97 fL   MCH 28.3 26.6 - 33.0 pg   MCHC 31.9 31.5 - 35.7 g/dL   RDW 59.5 63.8 - 75.6 %   Platelets 188 150 - 450 x10E3/uL   Neutrophils 68 Not Estab. %   Lymphs 15 Not Estab. %   Monocytes 16 Not Estab. %   Eos 1 Not Estab. %   Basos 0 Not Estab. %   Neutrophils Absolute 1.5 1.4 - 7.0 x10E3/uL   Lymphocytes Absolute 0.3 (L) 0.7 - 3.1 x10E3/uL   Monocytes Absolute 0.4 0.1 - 0.9 x10E3/uL   EOS (ABSOLUTE) 0.0 0.0 - 0.4 x10E3/uL   Basophils Absolute 0.0 0.0 - 0.2 x10E3/uL   Immature Granulocytes 0 Not Estab. %   Immature Grans (Abs) 0.0 0.0 - 0.1 x10E3/uL  Levetiracetam level     Status: Abnormal   Collection Time: 03/11/21 11:33 AM  Result Value Ref Range   Levetiracetam Lvl 41.8 (H) 10.0 - 40.0 ug/mL  Valproic acid level     Status: Abnormal   Collection Time: 03/11/21 11:33 AM  Result Value Ref Range  Valproic Acid Lvl 145 (HH) 50 - 100 ug/mL    Comment:                                 Detection Limit =  4                            <4 indicates None Detected Toxicity may occur at levels of 100-500. Measurements of free unbound valproic acid may improve the assess- ment of clinical response. Patient drug level exceeds published reference range.  Evaluate clinically for signs of potential toxicity.   Comprehensive metabolic panel     Status: Abnormal   Collection Time: 03/12/21 11:20 AM  Result Value Ref Range   Glucose, Bld 299 (H) 65 - 139 mg/dL    Comment: .        Non-fasting reference interval .    BUN 14 7 - 25 mg/dL   Creat 1.61 0.96 - 0.45 mg/dL   BUN/Creatinine Ratio NOT APPLICABLE 6 - 22 (calc)   Sodium 136 135 - 146 mmol/L   Potassium 4.0 3.5 - 5.3 mmol/L   Chloride 98 98 - 110 mmol/L   CO2 30 20 - 32 mmol/L   Calcium 9.9 8.6 - 10.3 mg/dL   Total Protein 7.1 6.1 - 8.1 g/dL   Albumin 4.5 3.6 - 5.1 g/dL   Globulin 2.6 1.9 - 3.7 g/dL (calc)   AG Ratio 1.7 1.0 - 2.5 (calc)   Total Bilirubin 0.5 0.2 - 1.2 mg/dL   Alkaline phosphatase (APISO) 47 36 - 130 U/L   AST 13 10 - 40 U/L   ALT 16 9 - 46 U/L  Vitamin D, 25-hydroxy     Status: Abnormal   Collection Time: 03/12/21 11:20 AM  Result Value Ref Range   Vit D, 25-Hydroxy 27 (L) 30 - 100 ng/mL    Comment: Vitamin D Status         25-OH Vitamin D: . Deficiency:                    <20 ng/mL Insufficiency:             20 - 29 ng/mL Optimal:                 > or = 30 ng/mL . For 25-OH Vitamin D testing on patients on  D2-supplementation and patients for whom quantitation  of D2 and D3 fractions is required, the Quest[facility]ureD(TM) 25-OH VIT D, (D2,D3), LC/MS/MS is recommended: order  code 40981 (patients >77yrs). See Note 1 . Note 1 . For additional information, please refer to  http://education.[facility].com/faq/FAQ199  (This link is being provided for informational/ educational purposes only.)   Lipid Panel     Status: Abnormal   Collection Time: 03/12/21 11:20 AM  Result Value Ref Range   Cholesterol  225 (H) <200 mg/dL   HDL 44 > OR = 40 mg/dL   Triglycerides 191 (H) <150 mg/dL    Comment: . If a non-fasting specimen was collected, consider repeat triglyceride testing on a fasting specimen if clinically indicated.  Perry Mount et al. J. of Clin. Lipidol. 2015;9:129-169. Marland Kitchen    LDL Cholesterol (Calc) 137 (H) mg/dL (calc)    Comment: Reference range: <100 . Desirable range <100 mg/dL for primary prevention;   <70 mg/dL for patients with CHD or diabetic patients  with > or =  2 CHD risk factors. Marland Kitchen LDL-C is now calculated using the Martin-Hopkins  calculation, which is a validated novel method providing  better accuracy than the Friedewald equation in the  estimation of LDL-C.  Horald Pollen et al. Lenox Ahr. 1610;960(45): 2061-2068  (http://education.[facility].com/faq/FAQ164)    Total CHOL/HDL Ratio 5.1 (H) <5.0 (calc)   Non-HDL Cholesterol (Calc) 181 (H) <130 mg/dL (calc)    Comment: For patients with diabetes plus 1 major ASCVD risk  factor, treating to a non-HDL-C goal of <100 mg/dL  (LDL-C of <40 mg/dL) is considered a therapeutic  option.   TSH     Status: None   Collection Time: 03/12/21 11:20 AM  Result Value Ref Range   TSH 2.38 0.40 - 4.50 mIU/L  T4, Free     Status: None   Collection Time: 03/12/21 11:20 AM  Result Value Ref Range   Free T4 1.4 0.8 - 1.8 ng/dL   CMP Latest Ref Rng & Units 03/12/2021 02/08/2021 09/10/2020  Glucose 65 - 139 mg/dL 981(X) - 914(N)  BUN 7 - 25 mg/dL 14 14 11   Creatinine 0.60 - 1.26 mg/dL 8.29 0.9 5.62  Sodium 130 - 146 mmol/L 136 140 144  Potassium 3.5 - 5.3 mmol/L 4.0 4.4 4.5  Chloride 98 - 110 mmol/L 98 105 105  CO2 20 - 32 mmol/L 30 26(A) 23  Calcium 8.6 - 10.3 mg/dL 9.9 8.8 9.5  Total Protein 6.1 - 8.1 g/dL 7.1 - 7.0  Total Bilirubin 0.2 - 1.2 mg/dL 0.5 - 0.4  Alkaline Phos 25 - 125 - 36 44  AST 10 - 40 U/L 13 12(A) 16  ALT 9 - 46 U/L 16 11 17    Lipid Panel     Component Value Date/Time   CHOL 225 (H) 03/12/2021 1120   CHOL 198  09/21/2018 1453   TRIG 307 (H) 03/12/2021 1120   HDL 44 03/12/2021 1120   HDL 76 09/21/2018 1453   CHOLHDL 5.1 (H) 03/12/2021 1120   LDLCALC 137 (H) 03/12/2021 1120   LABVLDL 22 09/21/2018 1453    Assessment & Plan:   1) Uncontrolled type 2 diabetes mellitus with hyperglycemia (HCC)  - Martin Stevenson has currently uncontrolled symptomatic type 2 DM since 35 years of age.  He presents today, with no meter or logs to review.  His most recent A1c was 9% on 02/08/21.  He ran out his medications 4-5 months ago and had problems getting it refilled (was missing his insurance card and labs) which has contributed to his decline.  He does typically check glucose once daily before breakfast, but his battery on his meter recently died.   -He does not report any gross complications from his diabetes, however patient with multiple sclerosis as well as seizure disorders,  and he remains at a high risk for more acute and chronic complications which include CAD, CVA, CKD, retinopathy, and neuropathy. These are all discussed in detail with him.  - Nutritional counseling repeated at each appointment due to patients tendency to fall back in to old habits.  - The patient admits there is a room for improvement in their diet and drink choices. -  Suggestion is made for the patient to avoid simple carbohydrates from their diet including Cakes, Sweet Desserts / Pastries, Ice Cream, Soda (diet and regular), Sweet Tea, Candies, Chips, Cookies, Sweet Pastries, Store Bought Juices, Alcohol in Excess of 1-2 drinks a day, Artificial Sweeteners, Coffee Creamer, and "Sugar-free" Products. This will help patient to have stable blood glucose profile  and potentially avoid unintended weight gain.   - I encouraged the patient to switch to unprocessed or minimally processed complex starch and increased protein intake (animal or plant source), fruits, and vegetables.   - Patient is advised to stick to a routine mealtimes to eat 3  meals a day and avoid unnecessary snacks (to snack only to correct hypoglycemia).  -He is advised to restart his Metformin 1000 mg p.o. twice daily and Glipizide 10 mg XL daily at breakfast.  -He is willing to continue monitoring blood glucose at least once a day-daily before breakfast and at any other time as needed and to call the clinic if he has readings less than 70 or greater than 200 for 3 tests in a row.  - Patient specific target  A1c;  LDL, HDL, Triglycerides,  were discussed in detail.  2) BP/HTN:  His blood pressure is controlled to target.  He is allergic to ACE inhibitors.  He is advised to restart Losartan- HCT 100-25 mg po daily.   3) Lipids/HPL:  His most recent lipid panel from 03/12/21 shows uncontrolled LDL of 125 and elevated triglycerides of 223.  He had been out of his statin.  He is advised to restart his Lipitor 20 mg po daily at bedtime.  Side effects and precautions discussed with him.  4)  Weight/Diet:  His Body mass index is 30.54 kg/m.  I discussed with him the fact that loss of 5 - 10% of his  current body weight will have the most impact on his diabetes management.  CDE Consult will be initiated . Exercise, and detailed carbohydrates information provided  -  detailed on discharge instructions.  5) Vitamin D deficiency:  His most recent vitamin D level was 27 on 03/12/21.  He is advised to start OTC Vitamin D3 5000 units daily. He has been on Ergocalciferol in the past.  6) Chronic Care/Health Maintenance: -he is on ARB and statin medications and is encouraged to initiate and continue to follow up with Ophthalmology, Dentist,  Podiatrist at least yearly or according to recommendations, and advised to stay away from smoking. I have recommended yearly flu vaccine and pneumonia vaccine at least every 5 years; moderate intensity exercise for up to 150 minutes weekly; and  sleep for at least 7 hours a day.  - I advised patient to maintain close follow up with Avon GullyFanta,  Tesfaye, MD for primary care needs.    I spent 42 minutes in the care of the patient today including review of labs from CMP, Lipids, Thyroid Function, Hematology (current and previous including abstractions from other facilities); face-to-face time discussing  his blood glucose readings/logs, discussing hypoglycemia and hyperglycemia episodes and symptoms, medications doses, his options of short and long term treatment based on the latest standards of care / guidelines;  discussion about incorporating lifestyle medicine;  and documenting the encounter.    Please refer to Patient Instructions for Blood Glucose Monitoring and Insulin/Medications Dosing Guide"  in media tab for additional information. Please  also refer to " Patient Self Inventory" in the Media  tab for reviewed elements of pertinent patient history.  Martin Stevenson participated in the discussions, expressed understanding, and voiced agreement with the above plans.  All questions were answered to his satisfaction. he is encouraged to contact clinic should he have any questions or concerns prior to his return visit.   Follow up plan: - Return in about 4 months (around 07/18/2021) for Diabetes F/U- A1c and UM in office, Bring  meter and logs, No previsit labs.  Ronny Bacon, [facility] [address] [place], [place] [address] Phone: [phone] Fax: [phone]  03/18/2021, 11:33 AM

## 2021-03-18 NOTE — Patient Instructions (Signed)
Diabetes Mellitus and Nutrition, Adult When you have diabetes, or diabetes mellitus, it is very important to have healthy eating habits because your blood sugar (glucose) levels are greatly affected by what you eat and drink. Eating healthy foods in the right amounts, at about the same times every day, can help you:  Control your blood glucose.  Lower your risk of heart disease.  Improve your blood pressure.  Reach or maintain a healthy weight. What can affect my meal plan? Every person with diabetes is different, and each person has different needs for a meal plan. Your health care provider may recommend that you work with a dietitian to make a meal plan that is best for you. Your meal plan may vary depending on factors such as:  The calories you need.  The medicines you take.  Your weight.  Your blood glucose, blood pressure, and cholesterol levels.  Your activity level.  Other health conditions you have, such as heart or kidney disease. How do carbohydrates affect me? Carbohydrates, also called carbs, affect your blood glucose level more than any other type of food. Eating carbs naturally raises the amount of glucose in your blood. Carb counting is a method for keeping track of how many carbs you eat. Counting carbs is important to keep your blood glucose at a healthy level, especially if you use insulin or take certain oral diabetes medicines. It is important to know how many carbs you can safely have in each meal. This is different for every person. Your dietitian can help you calculate how many carbs you should have at each meal and for each snack. How does alcohol affect me? Alcohol can cause a sudden decrease in blood glucose (hypoglycemia), especially if you use insulin or take certain oral diabetes medicines. Hypoglycemia can be a life-threatening condition. Symptoms of hypoglycemia, such as sleepiness, dizziness, and confusion, are similar to symptoms of having too much  alcohol.  Do not drink alcohol if: ? Your health care provider tells you not to drink. ? You are pregnant, may be pregnant, or are planning to become pregnant.  If you drink alcohol: ? Do not drink on an empty stomach. ? Limit how much you use to:  0-1 drink a day for women.  0-2 drinks a day for men. ? Be aware of how much alcohol is in your drink. In the [place] one drink equals one 12 oz bottle of beer (355 mL), one 5 oz glass of wine (148 mL), or one 1 oz glass of hard liquor (44 mL). ? Keep yourself hydrated with water, diet soda, or unsweetened iced tea.  Keep in mind that regular soda, juice, and other mixers may contain a lot of sugar and must be counted as carbs. What are tips for following this plan? Reading food labels  Start by checking the serving size on the "Nutrition Facts" label of packaged foods and drinks. The amount of calories, carbs, fats, and other nutrients listed on the label is based on one serving of the item. Many items contain more than one serving per package.  Check the total grams (g) of carbs in one serving. You can calculate the number of servings of carbs in one serving by dividing the total carbs by 15. For example, if a food has 30 g of total carbs per serving, it would be equal to 2 servings of carbs.  Check the number of grams (g) of saturated fats and trans fats in one serving. Choose foods that have   a low amount or none of these fats.  Check the number of milligrams (mg) of salt (sodium) in one serving. Most people should limit total sodium intake to less than 2,300 mg per day.  Always check the nutrition information of foods labeled as "low-fat" or "nonfat." These foods may be higher in added sugar or refined carbs and should be avoided.  Talk to your dietitian to identify your daily goals for nutrients listed on the label. Shopping  Avoid buying canned, pre-made, or processed foods. These foods tend to be high in fat, sodium, and added  sugar.  Shop around the outside edge of the grocery store. This is where you will most often find fresh fruits and vegetables, bulk grains, fresh meats, and fresh dairy. Cooking  Use low-heat cooking methods, such as baking, instead of high-heat cooking methods like deep frying.  Cook using healthy oils, such as olive, canola, or sunflower oil.  Avoid cooking with butter, cream, or high-fat meats. Meal planning  Eat meals and snacks regularly, preferably at the same times every day. Avoid going long periods of time without eating.  Eat foods that are high in fiber, such as fresh fruits, vegetables, beans, and whole grains. Talk with your dietitian about how many servings of carbs you can eat at each meal.  Eat 4-6 oz (112-168 g) of lean protein each day, such as lean meat, chicken, fish, eggs, or tofu. One ounce (oz) of lean protein is equal to: ? 1 oz (28 g) of meat, chicken, or fish. ? 1 egg. ?  cup (62 g) of tofu.  Eat some foods each day that contain healthy fats, such as avocado, nuts, seeds, and fish.   What foods should I eat? Fruits Berries. Apples. Oranges. Peaches. Apricots. Plums. Grapes. Mango. Papaya. Pomegranate. Kiwi. Cherries. Vegetables Lettuce. Spinach. Leafy greens, including kale, chard, collard greens, and mustard greens. Beets. Cauliflower. Cabbage. Broccoli. Carrots. Green beans. Tomatoes. Peppers. Onions. Cucumbers. Brussels sprouts. Grains Whole grains, such as whole-wheat or whole-grain bread, crackers, tortillas, cereal, and pasta. Unsweetened oatmeal. Quinoa. Brown or wild rice. Meats and other proteins Seafood. Poultry without skin. Lean cuts of poultry and beef. Tofu. Nuts. Seeds. Dairy Low-fat or fat-free dairy products such as milk, yogurt, and cheese. The items listed above may not be a complete list of foods and beverages you can eat. Contact a dietitian for more information. What foods should I avoid? Fruits Fruits canned with  syrup. Vegetables Canned vegetables. Frozen vegetables with butter or cream sauce. Grains Refined white flour and flour products such as bread, pasta, snack foods, and cereals. Avoid all processed foods. Meats and other proteins Fatty cuts of meat. Poultry with skin. Breaded or fried meats. Processed meat. Avoid saturated fats. Dairy Full-fat yogurt, cheese, or milk. Beverages Sweetened drinks, such as soda or iced tea. The items listed above may not be a complete list of foods and beverages you should avoid. Contact a dietitian for more information. Questions to ask a health care provider  Do I need to meet with a diabetes educator?  Do I need to meet with a dietitian?  What number can I call if I have questions?  When are the best times to check my blood glucose? Where to find more information:  American Diabetes Association: diabetes.org  Academy of Nutrition and Dietetics: www.eatright.org  National Institute of Diabetes and Digestive and Kidney Diseases: www.niddk.nih.gov  Association of Diabetes Care and Education Specialists: www.diabeteseducator.org Summary  It is important to have healthy eating   habits because your blood sugar (glucose) levels are greatly affected by what you eat and drink.  A healthy meal plan will help you control your blood glucose and maintain a healthy lifestyle.  Your health care provider may recommend that you work with a dietitian to make a meal plan that is best for you.  Keep in mind that carbohydrates (carbs) and alcohol have immediate effects on your blood glucose levels. It is important to count carbs and to use alcohol carefully. This information is not intended to replace advice given to you by your health care provider. Make sure you discuss any questions you have with your health care provider. Document Revised: 07/05/2019 Document Reviewed: 07/05/2019 Elsevier Patient Education  2021 Elsevier Inc.  

## 2021-03-22 ENCOUNTER — Other Ambulatory Visit: Payer: Self-pay

## 2021-03-22 ENCOUNTER — Ambulatory Visit
Admission: RE | Admit: 2021-03-22 | Discharge: 2021-03-22 | Disposition: A | Discharge status: Home / Self Care | Payer: Medicaid Other | Source: Ambulatory Visit | Attending: Neurology | Admitting: Neurology

## 2021-03-22 DIAGNOSIS — G8929 Other chronic pain: Secondary | ICD-10-CM

## 2021-03-22 DIAGNOSIS — M25562 Pain in left knee: Secondary | ICD-10-CM

## 2021-03-22 DIAGNOSIS — G35 Multiple sclerosis: Secondary | ICD-10-CM

## 2021-03-22 MED ORDER — GADOBENATE DIMEGLUMINE 529 MG/ML IV SOLN
20.0000 mL | Freq: Once | INTRAVENOUS | Status: AC | PRN
  Administered 2021-03-22: 20 mL via INTRAVENOUS

## 2021-03-24 ENCOUNTER — Telehealth: Payer: Self-pay | Admitting: Neurology

## 2021-03-24 DIAGNOSIS — G8929 Other chronic pain: Secondary | ICD-10-CM

## 2021-03-24 DIAGNOSIS — M25562 Pain in left knee: Secondary | ICD-10-CM

## 2021-03-24 NOTE — Telephone Encounter (Signed)
I called patient.  MRI of the brain is stable from March 2021, no change in medical therapy, the patient is on Mayzent.  X-ray the left knee showed a least a moderate level of arthritis, possible loose body, the patient is amenable to getting an orthopedic referral, I will set this up.   XR left knee 03/23/21:  IMPRESSION: 1. No acute findings. 2. Moderate tricompartmental osteoarthritis.  Possible loose bodies.   MRI brain 03/23/21:  IMPRESSION: This MRI of the brain with and without contrast shows the following: 1.   T2/FLAIR hyperintense foci in the left inferior cerebellar peduncle and in the periventricular and deep white matter of the hemispheres.  These are consistent with chronic demyelinating plaque associated with multiple sclerosis.  None of the foci appear to be acute.  They do not enhance.  Compared to the MRI dated 11/01/2019, there were no new lesions. 2.   Normal enhancement pattern.  No acute findings.

## 2021-03-28 ENCOUNTER — Ambulatory Visit (INDEPENDENT_AMBULATORY_CARE_PROVIDER_SITE_OTHER): Payer: Medicaid Other | Admitting: Orthopaedic Surgery

## 2021-03-28 ENCOUNTER — Other Ambulatory Visit: Payer: Self-pay

## 2021-03-28 DIAGNOSIS — M1712 Unilateral primary osteoarthritis, left knee: Secondary | ICD-10-CM | POA: Diagnosis not present

## 2021-03-28 NOTE — Progress Notes (Signed)
Office Visit Note   Patient: Martin Stevenson           Date of Birth: 1986-04-24           MRN: [id number] Visit Date: 03/28/2021              Requested by: York Spaniel, MD [address] [place],  [place] [address] PCP: Avon Gully, MD   Assessment & Plan: Visit Diagnoses:  1. Unilateral primary osteoarthritis, left knee     Plan: We discussed treatment options.  He has fairly advanced arthritis for his young age.  If he develops repetitive locking symptoms then we could proceed with knee arthroscopy.  We discussed possibility of total knee arthroplasty in the future but currently he is awfully young for total knee arthroplasty discussion.  X-rays do show some fairly large loose bodies but likely large enough they cannot slip into the joint unless they end up in the patellofemoral joint.  We can check him Back in 3 months if he has increased or persistent symptoms.  Follow-Up Instructions: No follow-ups on file.   Orders:  No orders of the defined types were placed in this encounter.  No orders of the defined types were placed in this encounter.     Procedures: No procedures performed   Clinical Data: No additional findings.   Subjective: Chief Complaint  Patient presents with   Left Knee - Pain    HPI 35 year old male with left knee osteoarthritis with pain and swelling.  He states it hurts the most after he has been laying down.  Bothers him going up and down steps or if he is walking faster.  Stiffness if he sits too long.  He does not work has diagnosis of multiple sclerosis type 2 diabetes hypertension hyperlipidemia and seizures. X-rays 03/24/2021 ordered by Dr. Stephanie Acre showed moderate tricompartmental arthritis right knee possible loose bodies.  MRI scan of the brain March 2021 is stable. Review of Systems all other systems negative other than as mentioned above.   Objective: Vital Signs: BP 108/80   Pulse 79   Ht 5\' 7"  (1.702 m)    Wt 195 lb (88.5 kg)   BMI 30.54 kg/m   Physical Exam Constitutional:      Appearance: He is well-developed.  HENT:     Head: Normocephalic and atraumatic.     Right Ear: External ear normal.     Left Ear: External ear normal.  Eyes:     Pupils: Pupils are equal, round, and reactive to light.  Neck:     Thyroid: No thyromegaly.     Trachea: No tracheal deviation.  Cardiovascular:     Rate and Rhythm: Normal rate.  Pulmonary:     Effort: Pulmonary effort is normal.     Breath sounds: No wheezing.  Abdominal:     General: Bowel sounds are normal.     Palpations: Abdomen is soft.  Musculoskeletal:     Cervical back: Neck supple.  Skin:    General: Skin is warm and dry.     Capillary Refill: Capillary refill takes less than 2 seconds.  Neurological:     Mental Status: He is alert and oriented to person, place, and time.  Psychiatric:        Behavior: Behavior normal.        Thought Content: Thought content normal.        Judgment: Judgment normal.    Ortho Exam patient has significant crepitus with right  knee range of motion.  Palpable medial osteophytes.  Reflexes are intact.  Specialty Comments:  No specialty comments available.  Imaging: CLINICAL DATA:  Chronic left knee pain.  No injury.   EXAM: LEFT KNEE - 1-2 VIEW   COMPARISON:  None.   FINDINGS: Moderate tricompartmental osteoarthritis is present. No acute fracture or dislocation. No definite joint effusion. Suggestion of several possible loose bodies on the lateral film.   IMPRESSION: 1. No acute findings. 2. Moderate tricompartmental osteoarthritis.  Possible loose bodies.     Electronically Signed   By: Elberta Fortis M.D.   On: 03/24/2021 14:33   PMFS History: Patient Active Problem List   Diagnosis Date Noted   Unilateral primary osteoarthritis, left knee 04/03/2021   Essential hypertension, benign 08/22/2019   Mixed hyperlipidemia 08/22/2019   Uncontrolled type 2 diabetes mellitus with  hyperglycemia (HCC) 09/22/2018   Vitamin D deficiency 09/22/2018   Therapeutic drug monitoring 09/09/2018   Multiple sclerosis (HCC) 12/29/2017   Seizures (HCC) 09/28/2017   Past Medical History:  Diagnosis Date   Diabetes (HCC)    Fluid retention    Hypertension    Multiple sclerosis (HCC) 12/29/2017   Seizures (HCC)     Family History  Problem Relation Age of Onset   Diabetes Mother    Pulmonary embolism Mother        hx of blood clots   Diabetes Father     Past Surgical History:  Procedure Laterality Date   MOUTH SURGERY     x3, jaw, hip, chin   Social History   Occupational History    Comment: Unemployed  Tobacco Use   Smoking status: Never   Smokeless tobacco: Never  Vaping Use   Vaping Use: Never used  Substance and Sexual Activity   Alcohol use: No    Comment: Quit: 2012   Drug use: No   Sexual activity: Not on file

## 2021-04-03 DIAGNOSIS — M1712 Unilateral primary osteoarthritis, left knee: Secondary | ICD-10-CM | POA: Insufficient documentation

## 2021-04-11 ENCOUNTER — Telehealth: Payer: Self-pay | Admitting: Neurology

## 2021-04-11 MED ORDER — MAYZENT 2 MG PO TABS: 1.0000 | ORAL_TABLET | Freq: Every day | ORAL | 11 refills | Status: DC

## 2021-04-11 NOTE — Telephone Encounter (Signed)
Pt request refillSiponimod Fumarate (MAYZENT) 2 MG TABS at  [facility]

## 2021-04-11 NOTE — Telephone Encounter (Signed)
Refill has been sent to pharmacy.  

## 2021-07-08 ENCOUNTER — Telehealth: Payer: Self-pay | Admitting: Neurology

## 2021-07-08 MED ORDER — MAYZENT 2 MG PO TABS: 1.0000 | ORAL_TABLET | Freq: Every day | ORAL | 11 refills | Status: DC

## 2021-07-08 NOTE — Telephone Encounter (Signed)
Pt request refill for Siponimod Fumarate (MAYZENT) 2 MG TABS at [facility]

## 2021-07-08 NOTE — Telephone Encounter (Signed)
Refill has been sent.  °

## 2021-07-18 ENCOUNTER — Ambulatory Visit: Payer: Medicaid Other | Admitting: Nurse Practitioner

## 2021-07-18 NOTE — Patient Instructions (Incomplete)

## 2021-07-29 ENCOUNTER — Other Ambulatory Visit: Payer: Self-pay

## 2021-07-29 ENCOUNTER — Encounter: Payer: Medicaid Other | Admitting: Nurse Practitioner

## 2021-07-29 ENCOUNTER — Encounter: Payer: Self-pay | Admitting: Nurse Practitioner

## 2021-07-29 VITALS — Ht 67.0 in

## 2021-07-29 MED ORDER — GLUCOSE BLOOD VI STRP: ORAL_STRIP | 1 refills | Status: DC

## 2021-07-29 MED ORDER — GLIPIZIDE ER 5 MG PO TB24: ORAL_TABLET | ORAL | 0 refills | Status: DC

## 2021-07-29 NOTE — Patient Instructions (Signed)

## 2021-07-29 NOTE — Progress Notes (Signed)
Erroneous encounter

## 2021-08-14 NOTE — Patient Instructions (Signed)

## 2021-08-15 ENCOUNTER — Other Ambulatory Visit: Payer: Self-pay

## 2021-08-15 ENCOUNTER — Encounter: Payer: Self-pay | Admitting: Nurse Practitioner

## 2021-08-15 ENCOUNTER — Ambulatory Visit (INDEPENDENT_AMBULATORY_CARE_PROVIDER_SITE_OTHER): Payer: Medicaid Other | Admitting: Nurse Practitioner

## 2021-08-15 VITALS — BP 111/67 | HR 73 | Ht 67.0 in | Wt 199.0 lb

## 2021-08-15 DIAGNOSIS — I10 Essential (primary) hypertension: Secondary | ICD-10-CM | POA: Diagnosis not present

## 2021-08-15 DIAGNOSIS — E559 Vitamin D deficiency, unspecified: Secondary | ICD-10-CM | POA: Diagnosis not present

## 2021-08-15 DIAGNOSIS — E1165 Type 2 diabetes mellitus with hyperglycemia: Secondary | ICD-10-CM

## 2021-08-15 DIAGNOSIS — E782 Mixed hyperlipidemia: Secondary | ICD-10-CM

## 2021-08-15 LAB — POCT GLYCOSYLATED HEMOGLOBIN (HGB A1C): HbA1c, POC (controlled diabetic range): 12.2 % — AB (ref 0.0–7.0)

## 2021-08-15 NOTE — Progress Notes (Signed)
08/15/2021, 1:48 PM   Endocrinology follow-up note   Subjective:    Patient ID: Martin Stevenson, male    DOB: 1986/06/30.  Martin Stevenson is being seen in follow-up  for management of currently uncontrolled symptomatic diabetes, hyperlipidemia, hypertension.  PMD:  Avon Gully, MD.   Past Medical History:  Diagnosis Date   Diabetes [facility])    Fluid retention    Hypertension    Multiple sclerosis (HCC) 12/29/2017   Seizures (HCC)    Past Surgical History:  Procedure Laterality Date   MOUTH SURGERY     x3, jaw, hip, chin   Social History   Socioeconomic History   Marital status: Single    Spouse name: Not on file   Number of children: 3   Years of education: HS   Highest education level: Not on file  Occupational History    Comment: Unemployed  Tobacco Use   Smoking status: Never   Smokeless tobacco: Never  Vaping Use   Vaping Use: Never used  Substance and Sexual Activity   Alcohol use: No    Comment: Quit: 2012   Drug use: No   Sexual activity: Not on file  Other Topics Concern   Not on file  Social History Narrative   Pt lives at home with his spouse.   Caffeine Use: very little   Right handed    Social Determinants of Health   Financial Resource Strain: Not on file  Food Insecurity: Not on file  Transportation Needs: Not on file  Physical Activity: Not on file  Stress: Not on file  Social Connections: Not on file   Outpatient Encounter Medications as of 08/15/2021  Medication Sig   atorvastatin (LIPITOR) 20 MG tablet Take 1 tablet (20 mg total) by mouth daily.   cholecalciferol (VITAMIN D3) 25 MCG (1000 UNIT) tablet Take 1,000 Units by mouth daily.   divalproex (DEPAKOTE) 500 MG DR tablet 2 tablets in the morning, 3 in the evening   gabapentin (NEURONTIN) 600 MG tablet Take 1 tablet (600 mg total) by mouth at bedtime.   glipiZIDE (GLUCOTROL XL) 5 MG 24 hr tablet TAKE 2  TABLETS BY MOUTH ONCE DAILY WITH BREAKFAST   glucose blood test strip Use as instructed to test blood glucose two times daily. Once before breakfast and again at bedtime.   hydrochlorothiazide (HYDRODIURIL) 25 MG tablet Take 1 tablet by mouth once daily   levETIRAcetam (KEPPRA) 750 MG tablet Take 2 tablets twice daily   losartan-hydrochlorothiazide (HYZAAR) 100-25 MG tablet Take 1 tablet by mouth daily.   metFORMIN (GLUCOPHAGE) 1000 MG tablet Take 1 tablet (1,000 mg total) by mouth 2 (two) times daily with a meal.   Siponimod Fumarate (MAYZENT) 2 MG TABS Take 1 tablet by mouth daily.   [DISCONTINUED] Vitamin D, Ergocalciferol, (DRISDOL) 1.25 MG (50000 UNIT) CAPS capsule Take 1 capsule by mouth once a week (Patient not taking: Reported on 08/15/2021)   No facility-administered encounter medications on file as of 08/15/2021.    ALLERGIES: Allergies  Allergen Reactions   Benazepril     States that it made his mouth burn   Shellfish Allergy     VACCINATION STATUS:  There is no immunization history on file for this patient.  Diabetes He presents for his follow-up diabetic visit. He has type 2 diabetes mellitus. Onset time: He was diagnosed at approximate age of 44 years. His disease course has been worsening. There are no hypoglycemic associated symptoms. Pertinent negatives for hypoglycemia include no confusion, headaches, pallor or seizures. Associated symptoms include polydipsia and polyuria. Pertinent negatives for diabetes include no chest pain, no fatigue, no polyphagia and no weakness. There are no hypoglycemic complications. Symptoms are stable. There are no diabetic complications. Risk factors for coronary artery disease include diabetes mellitus, male sex, obesity and sedentary lifestyle. Current diabetic treatment includes oral agent (dual therapy). He is compliant with treatment some of the time (had run out of meds several different occasions). His weight is fluctuating minimally. He is  following a generally healthy diet. When asked about meal planning, he reported none. He has not had a previous visit with a dietitian. He participates in exercise intermittently. His home blood glucose trend is decreasing steadily. His overall blood glucose range is 140-180 mg/dl. (He presents today with his meter, no logs, showing improving glycemic profile overall.  His POCT A1c today is 12.2% worsening from last visit of 9%.  He states he ran out of medications for a period of time and also had multiple deaths in his family causing more stress.  He is now getting back on track and his glucose is trending down.  Analysis of his meter shows 7-day average of 172, 14-day average of 205, 30 and 90-day averages of 228.  He denies any hypoglycemia. ) An ACE inhibitor/angiotensin II receptor blocker is being taken. He does not see a podiatrist.Eye exam is current.  Hyperlipidemia This is a chronic problem. The current episode started more than 1 year ago. The problem is uncontrolled. Recent lipid tests were reviewed and are high. Exacerbating diseases include diabetes. Factors aggravating his hyperlipidemia include thiazides. Pertinent negatives include no chest pain. Current antihyperlipidemic treatment includes statins. The current treatment provides mild improvement of lipids. There are no compliance problems.  Risk factors for coronary artery disease include diabetes mellitus, dyslipidemia, male sex, obesity, a sedentary lifestyle and family history.  Hypertension This is a chronic problem. The current episode started more than 1 year ago. The problem is unchanged. The problem is controlled. Pertinent negatives include no chest pain or headaches. There are no associated agents to hypertension. Risk factors for coronary artery disease include diabetes mellitus, dyslipidemia, family history and male gender. Past treatments include angiotensin blockers and diuretics. The current treatment provides mild  improvement. There are no compliance problems.     Review of systems  Constitutional: + Minimally fluctuating body weight,  current Body mass index is 31.17 kg/m. , no fatigue, no subjective hyperthermia, no subjective hypothermia Eyes: no blurry vision, no xerophthalmia ENT: no sore throat, no nodules palpated in throat, no dysphagia/odynophagia, no hoarseness Cardiovascular: no chest pain, no shortness of breath, no palpitations, no leg swelling Respiratory: no cough, no shortness of breath Gastrointestinal: no nausea/vomiting/diarrhea Musculoskeletal: no muscle/joint aches Skin: no rashes, no hyperemia Neurological: no tremors, no numbness, no tingling, no dizziness Psychiatric: no depression, no anxiety   Objective:    BP 111/67    Pulse 73    Ht 5\' 7"  (1.702 m)    Wt 199 lb (90.3 kg)    SpO2 98%    BMI 31.17 kg/m   Wt Readings from Last 3 Encounters:  08/15/21 199 lb (90.3 kg)  03/28/21  195 lb (88.5 kg)  03/18/21 195 lb (88.5 kg)    BP Readings from Last 3 Encounters:  08/15/21 111/67  03/28/21 108/80  03/18/21 139/82    Physical Exam- Limited  Constitutional:  Body mass index is 31.17 kg/m. , not in acute distress, normal state of mind Eyes:  EOMI, no exophthalmos Neck: Supple Cardiovascular: RRR, no murmurs, rubs, or gallops, no edema Respiratory: Adequate breathing efforts, no crackles, rales, rhonchi, or wheezing Musculoskeletal: no gross deformities, strength intact in all four extremities, no gross restriction of joint movements Skin:  no rashes, no hyperemia Neurological: no tremor with outstretched hands    Recent Results (from the past 2160 hour(s))  POCT glycosylated hemoglobin (Hb A1C)     Status: Abnormal   Collection Time: 08/15/21  1:42 PM  Result Value Ref Range   Hemoglobin A1C     HbA1c POC (<> result, manual entry)     HbA1c, POC (prediabetic range)     HbA1c, POC (controlled diabetic range) 12.2 (A) 0.0 - 7.0 %    CMP Latest Ref Rng &  Units 03/12/2021 02/08/2021 09/10/2020  Glucose 65 - 139 mg/dL 161(W) - 960(A)  BUN 7 - 25 mg/dL Creatinine 0.60 - 1.26 mg/dL 5.40 0.9 9.81  Sodium 191 - 146 mmol/L 136 140 144  Potassium 3.5 - 5.3 mmol/L 4.0 4.4 4.5  Chloride 98 - 110 mmol/L 98 105 105  CO2 20 - 32 mmol/L 30 26(A) 23  Calcium 8.6 - 10.3 mg/dL 9.9 8.8 9.5  Total Protein 6.1 - 8.1 g/dL 7.1 - 7.0  Total Bilirubin 0.2 - 1.2 mg/dL 0.5 - 0.4  Alkaline Phos 25 - 125 - 36 44  AST 10 - 40 U/L 13 12(A) 16  ALT 9 - 46 U/L Lipid Panel     Component Value Date/Time   CHOL 225 (H) 03/12/2021 1120   CHOL 198 09/21/2018 1453   TRIG 307 (H) 03/12/2021 1120   HDL 44 03/12/2021 1120   HDL 76 09/21/2018 1453   CHOLHDL 5.1 (H) 03/12/2021 1120   LDLCALC 137 (H) 03/12/2021 1120   LABVLDL 22 09/21/2018 1453    Assessment & Plan:   1) Uncontrolled type 2 diabetes mellitus with hyperglycemia (HCC)  - Jayde Aikman has currently uncontrolled symptomatic type 2 DM since 36 years of age.  He presents today with his meter, no logs, showing improving glycemic profile overall.  His POCT A1c today is 12.2% worsening from last visit of 9%.  He states he ran out of medications for a period of time and also had multiple deaths in his family causing more stress.  He is now getting back on track and his glucose is trending down.  Analysis of his meter shows 7-day average of 172, 14-day average of 205, 30 and 90-day averages of 228.  He denies any hypoglycemia.  -He does not report any gross complications from his diabetes, however patient with multiple sclerosis as well as seizure disorders,  and he remains at a high risk for more acute and chronic complications which include CAD, CVA, CKD, retinopathy, and neuropathy. These are all discussed in detail with him.  - Nutritional counseling repeated at each appointment due to patients tendency to fall back in to old habits.  - The patient admits there is a room for improvement in  their diet and drink choices. -  Suggestion is made for the patient to avoid simple carbohydrates from their diet including  Cakes, Sweet Desserts / Pastries, Ice Cream, Soda (diet and regular), Sweet Tea, Candies, Chips, Cookies, Sweet Pastries, Store Bought Juices, Alcohol in Excess of 1-2 drinks a day, Artificial Sweeteners, Coffee Creamer, and "Sugar-free" Products. This will help patient to have stable blood glucose profile and potentially avoid unintended weight gain.   - I encouraged the patient to switch to unprocessed or minimally processed complex starch and increased protein intake (animal or plant source), fruits, and vegetables.   - Patient is advised to stick to a routine mealtimes to eat 3 meals a day and avoid unnecessary snacks (to snack only to correct hypoglycemia).  -Given his improving glycemic profile over the past few weeks, he is advised to continue Metformin 1000 mg po twice daily with meals and Glipizide 10 mg XL daily with breakfast.   -He is willing to continue monitoring blood glucose at least once a day-daily before breakfast and at any other time as needed and to call the clinic if he has readings less than 70 or greater than 200 for 3 tests in a row.  - Patient specific target  A1c;  LDL, HDL, Triglycerides,  were discussed in detail.  2) BP/HTN:  His blood pressure is controlled to target.  He is allergic to ACE inhibitors.  He is advised to restart Losartan- HCT 100-25 mg po daily.   3) Lipids/HPL:  His most recent lipid panel from 03/12/21 shows uncontrolled LDL of 106 and elevated triglycerides of 307.   He is advised to continue his Lipitor 20 mg po daily at bedtime.  Side effects and precautions discussed with him.  4)  Weight/Diet:  His Body mass index is 31.17 kg/m.  I discussed with him the fact that loss of 5 - 10% of his  current body weight will have the most impact on his diabetes management.  CDE Consult will be initiated . Exercise, and detailed  carbohydrates information provided  -  detailed on discharge instructions.  5) Vitamin D deficiency:  His most recent vitamin D level was 27 on 03/12/21.  He is advised to start OTC Vitamin D3 5000 units daily. He has been on Ergocalciferol in the past.  6) Chronic Care/Health Maintenance: -he is on ARB and statin medications and is encouraged to initiate and continue to follow up with Ophthalmology, Dentist,  Podiatrist at least yearly or according to recommendations, and advised to stay away from smoking. I have recommended yearly flu vaccine and pneumonia vaccine at least every 5 years; moderate intensity exercise for up to 150 minutes weekly; and  sleep for at least 7 hours a day.  - I advised patient to maintain close follow up with Avon Gully, MD for primary care needs.     I spent 34 minutes in the care of the patient today including review of labs from CMP, Lipids, Thyroid Function, Hematology (current and previous including abstractions from other facilities); face-to-face time discussing  his blood glucose readings/logs, discussing hypoglycemia and hyperglycemia episodes and symptoms, medications doses, his options of short and long term treatment based on the latest standards of care / guidelines;  discussion about incorporating lifestyle medicine;  and documenting the encounter.    Please refer to Patient Instructions for Blood Glucose Monitoring and Insulin/Medications Dosing Guide"  in media tab for additional information. Please  also refer to " Patient Self Inventory" in the Media  tab for reviewed elements of pertinent patient history.  Daemyn Stockdale participated in the discussions, expressed understanding, and voiced agreement with  the above plans.  All questions were answered to his satisfaction. he is encouraged to contact clinic should he have any questions or concerns prior to his return visit.   Follow up plan: - Return in about 3 months (around 11/13/2021) for Diabetes  F/U- A1c and UM in office, Previsit labs, Bring meter and logs.  Ronny Bacon, [facility] [address] [place], [place] [address] Phone: [phone] Fax: [phone]  08/15/2021, 1:48 PM

## 2021-09-11 NOTE — Progress Notes (Deleted)
PATIENT: Martin Stevenson DOB: 1986/05/10  REASON FOR VISIT: Follow up for MS and seizures HISTORY FROM: Patient PRIMARY NEUROLOGIST:   HISTORY OF PRESENT ILLNESS: Today 09/11/21 Martin Stevenson is here today for follow-up with history of MS and seizures.  Is on Keppra and Depakote.  On Mayzent for MS.  Labs in August showed absolute lymphocyte count 0.3 on Mayzent, Depakote levels 145, Keppra level was 41.8.  MRI of the brain in August 2022 was stable from March 2021.  HISTORY  03/11/2021 Dr. Anne Hahn: Mr. Martin Stevenson is a 36 year old right-handed black male with a history of multiple sclerosis and a history of intractable seizures.  The patient generally has several seizures a month, he has gone 6 weeks since his last seizure event.  He remains on Keppra and Depakote.  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  He has a history of diabetes as well.  He has done well with his MS symptoms, he has not had any new symptoms since last seen.  He remains on Mayzent.  His last MRI was done a year and a half ago and showed a new brainstem lesion.  He was switched over to Mayzent at that time.  The patient reports that he has left knee discomfort that has been present for least several months.  He has increased discomfort if he is sitting for a long period time and then tries to walk.  The knee may bother him at nighttime as well when he is trying to sleep.  His most recent hemoglobin A1c was 9.0.  He returns for further evaluation.  REVIEW OF SYSTEMS: Out of a complete 14 system review of symptoms, the patient complains only of the following symptoms, and all other reviewed systems are negative.  ALLERGIES: Allergies  Allergen Reactions   Benazepril     States that it made his mouth burn   Shellfish Allergy     HOME MEDICATIONS: Outpatient Medications Prior to Visit  Medication Sig Dispense Refill   atorvastatin (LIPITOR) 20 MG tablet Take 1 tablet (20 mg total) by mouth  daily. 90 tablet 3   cholecalciferol (VITAMIN D3) 25 MCG (1000 UNIT) tablet Take 1,000 Units by mouth daily.     divalproex (DEPAKOTE) 500 MG DR tablet 2 tablets in the morning, 3 in the evening 450 tablet 3   gabapentin (NEURONTIN) 600 MG tablet Take 1 tablet (600 mg total) by mouth at bedtime. 30 tablet 5   glipiZIDE (GLUCOTROL XL) 5 MG 24 hr tablet TAKE 2 TABLETS BY MOUTH ONCE DAILY WITH BREAKFAST 60 tablet 0   glucose blood test strip Use as instructed to test blood glucose two times daily. Once before breakfast and again at bedtime. 100 each 1   hydrochlorothiazide (HYDRODIURIL) 25 MG tablet Take 1 tablet by mouth once daily 30 tablet 0   levETIRAcetam (KEPPRA) 750 MG tablet Take 2 tablets twice daily 360 tablet 3   losartan-hydrochlorothiazide (HYZAAR) 100-25 MG tablet Take 1 tablet by mouth daily. 90 tablet 3   metFORMIN (GLUCOPHAGE) 1000 MG tablet Take 1 tablet (1,000 mg total) by mouth 2 (two) times daily with a meal. 180 tablet 3   Siponimod Fumarate (MAYZENT) 2 MG TABS Take 1 tablet by mouth daily. 30 tablet 11   No facility-administered medications prior to visit.    PAST MEDICAL HISTORY: Past Medical History:  Diagnosis Date   Diabetes (HCC)    Fluid retention    Hypertension  Multiple sclerosis (HCC) 12/29/2017   Seizures (HCC)     PAST SURGICAL HISTORY: Past Surgical History:  Procedure Laterality Date   MOUTH SURGERY     x3, jaw, hip, chin    FAMILY HISTORY: Family History  Problem Relation Age of Onset   Diabetes Mother    Pulmonary embolism Mother        hx of blood clots   Diabetes Father     SOCIAL HISTORY: Social History   Socioeconomic History   Marital status: Single    Spouse name: Not on file   Number of children: 3   Years of education: HS   Highest education level: Not on file  Occupational History    Comment: Unemployed  Tobacco Use   Smoking status: Never   Smokeless tobacco: Never  Vaping Use   Vaping Use: Never used  Substance  and Sexual Activity   Alcohol use: No    Comment: Quit: 2012   Drug use: No   Sexual activity: Not on file  Other Topics Concern   Not on file  Social History Narrative   Pt lives at home with his spouse.   Caffeine Use: very little   Right handed    Social Determinants of Health   Financial Resource Strain: Not on file  Food Insecurity: Not on file  Transportation Needs: Not on file  Physical Activity: Not on file  Stress: Not on file  Social Connections: Not on file  Intimate Partner Violence: Not on file      PHYSICAL EXAM  There were no vitals filed for this visit. There is no height or weight on file to calculate BMI.  Generalized: Well developed, in no acute distress   Neurological examination  Mentation: Alert oriented to time, place, history taking. Follows all commands speech and language fluent Cranial nerve II-XII: Pupils were equal round reactive to light. Extraocular movements were full, visual field were full on confrontational test. Facial sensation and strength were normal. Uvula tongue midline. Head turning and shoulder shrug  were normal and symmetric. Motor: The motor testing reveals 5 over 5 strength of all 4 extremities. Good symmetric motor tone is noted throughout.  Sensory: Sensory testing is intact to soft touch on all 4 extremities. No evidence of extinction is noted.  Coordination: Cerebellar testing reveals good finger-nose-finger and heel-to-shin bilaterally.  Gait and station: Gait is normal. Tandem gait is normal. Romberg is negative. No drift is seen.  Reflexes: Deep tendon reflexes are symmetric and normal bilaterally.   DIAGNOSTIC DATA (LABS, IMAGING, TESTING) - I reviewed patient records, labs, notes, testing and imaging myself where available.  Lab Results  Component Value Date   WBC 2.3 (LL) 03/11/2021   HGB 14.4 03/11/2021   HCT 45.2 03/11/2021   MCV 89 03/11/2021   PLT 188 03/11/2021      Component Value Date/Time   NA 136  03/12/2021 1120   NA 140 02/08/2021 0000   K 4.0 03/12/2021 1120   CL 98 03/12/2021 1120   CO2 30 03/12/2021 1120   GLUCOSE 299 (H) 03/12/2021 1120   BUN 14 03/12/2021 1120   BUN 14 02/08/2021 0000   CREATININE 0.89 03/12/2021 1120   CALCIUM 9.9 03/12/2021 1120   PROT 7.1 03/12/2021 1120   PROT 7.0 09/10/2020 1016   ALBUMIN 4.1 02/08/2021 0000   ALBUMIN 4.0 09/10/2020 1016   AST 13 03/12/2021 1120   ALT 16 03/12/2021 1120   ALKPHOS 36 02/08/2021 0000   BILITOT 0.5  03/12/2021 1120   BILITOT 0.4 09/10/2020 1016   GFRNONAA 108 09/10/2020 1016   GFRNONAA 110 12/21/2019 0830   GFRAA 125 09/10/2020 1016   GFRAA 128 12/21/2019 0830   Lab Results  Component Value Date   CHOL 225 (H) 03/12/2021   HDL 44 03/12/2021   LDLCALC 137 (H) 03/12/2021   TRIG 307 (H) 03/12/2021   CHOLHDL 5.1 (H) 03/12/2021   Lab Results  Component Value Date   HGBA1C 12.2 (A) 08/15/2021   No results found for: VITAMINB12 Lab Results  Component Value Date   TSH 2.38 03/12/2021      ASSESSMENT AND PLAN 36 y.o. year old male  has a past medical history of Diabetes (HCC), Fluid retention, Hypertension, Multiple sclerosis (HCC) (12/29/2017), and Seizures (HCC). here with:  1.  Multiple sclerosis -MRI of the brain in August 2022 was overall stable, no change from March 2021 -Will continue Mayzent, check routine labs today 2.  Intractable seizures      Margie Ege, AGNP-C, DNP 09/11/2021, 11:45 AM [facility] [address] [place], [place] [address] [phone]

## 2021-09-12 ENCOUNTER — Ambulatory Visit: Payer: Medicaid Other | Admitting: Neurology

## 2021-09-12 ENCOUNTER — Telehealth: Payer: Self-pay | Admitting: Neurology

## 2021-09-12 NOTE — Telephone Encounter (Signed)
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

## 2021-11-09 ENCOUNTER — Other Ambulatory Visit: Payer: Self-pay | Admitting: Neurology

## 2021-11-13 ENCOUNTER — Encounter: Payer: Self-pay | Admitting: Nurse Practitioner

## 2021-11-13 ENCOUNTER — Ambulatory Visit (INDEPENDENT_AMBULATORY_CARE_PROVIDER_SITE_OTHER): Payer: Medicaid Other | Admitting: Nurse Practitioner

## 2021-11-13 VITALS — BP 107/71 | HR 72 | Ht 67.0 in | Wt 206.8 lb

## 2021-11-13 DIAGNOSIS — E782 Mixed hyperlipidemia: Secondary | ICD-10-CM

## 2021-11-13 DIAGNOSIS — E1165 Type 2 diabetes mellitus with hyperglycemia: Secondary | ICD-10-CM | POA: Diagnosis not present

## 2021-11-13 DIAGNOSIS — E559 Vitamin D deficiency, unspecified: Secondary | ICD-10-CM

## 2021-11-13 DIAGNOSIS — I10 Essential (primary) hypertension: Secondary | ICD-10-CM | POA: Diagnosis not present

## 2021-11-13 LAB — TSH: TSH: 2.55 u[IU]/mL (ref 0.450–4.500)

## 2021-11-13 LAB — COMPREHENSIVE METABOLIC PANEL
ALT: 9 IU/L (ref 0–44)
AST: 11 IU/L (ref 0–40)
Albumin/Globulin Ratio: 1.8 (ref 1.2–2.2)
Albumin: 4.4 g/dL (ref 4.0–5.0)
Alkaline Phosphatase: 43 IU/L — ABNORMAL LOW (ref 44–121)
BUN/Creatinine Ratio: 17 (ref 9–20)
BUN: 16 mg/dL (ref 6–20)
Bilirubin Total: 0.3 mg/dL (ref 0.0–1.2)
CO2: 23 mmol/L (ref 20–29)
Calcium: 10 mg/dL (ref 8.7–10.2)
Chloride: 99 mmol/L (ref 96–106)
Creatinine, Ser: 0.96 mg/dL (ref 0.76–1.27)
Globulin, Total: 2.5 g/dL (ref 1.5–4.5)
Glucose: 133 mg/dL — ABNORMAL HIGH (ref 70–99)
Potassium: 4.3 mmol/L (ref 3.5–5.2)
Sodium: 140 mmol/L (ref 134–144)
Total Protein: 6.9 g/dL (ref 6.0–8.5)
eGFR: 104 mL/min/{1.73_m2} (ref >59)

## 2021-11-13 LAB — T4, FREE: Free T4: 1.38 ng/dL (ref 0.82–1.77)

## 2021-11-13 LAB — LIPID PANEL
Chol/HDL Ratio: 3.4 ratio (ref 0.0–5.0)
Cholesterol, Total: 161 mg/dL (ref 100–199)
HDL: 47 mg/dL (ref >39)
LDL Chol Calc (NIH): 98 mg/dL (ref 0–99)
Triglycerides: 88 mg/dL (ref 0–149)
VLDL Cholesterol Cal: 16 mg/dL (ref 5–40)

## 2021-11-13 LAB — POCT GLYCOSYLATED HEMOGLOBIN (HGB A1C): HbA1c, POC (controlled diabetic range): 9.1 % — AB (ref 0.0–7.0)

## 2021-11-13 LAB — VITAMIN D 25 HYDROXY (VIT D DEFICIENCY, FRACTURES): Vit D, 25-Hydroxy: 18.3 ng/mL — ABNORMAL LOW (ref 30.0–100.0)

## 2021-11-13 MED ORDER — GLIPIZIDE ER 5 MG PO TB24: 10.0000 mg | ORAL_TABLET | Freq: Every day | ORAL | 3 refills | Status: DC

## 2021-11-13 MED ORDER — VITAMIN D (ERGOCALCIFEROL) 1.25 MG (50000 UNIT) PO CAPS: 50000.0000 [IU] | ORAL_CAPSULE | ORAL | 0 refills | Status: DC

## 2021-11-13 NOTE — Patient Instructions (Signed)

## 2021-11-13 NOTE — Progress Notes (Signed)
? ?                                                    ?     11/13/2021, 1:38 PM ? ? ?Endocrinology follow-up note ? ? ?Subjective:  ? ? Patient ID: Martin Stevenson, male    DOB: 03/31/1986.  ?Martin Stevenson is being seen in follow-up  for management of currently uncontrolled symptomatic diabetes, hyperlipidemia, hypertension. ? ?PMD:  Carrolyn Meiers, MD. ? ? ?Past Medical History:  ?Diagnosis Date  ? Diabetes ([place])   ? Fluid retention   ? Hypertension   ? Multiple sclerosis ([place]) 12/29/2017  ? Seizures ([place])   ? ?Past Surgical History:  ?Procedure Laterality Date  ? MOUTH SURGERY    ? x3, jaw, hip, chin  ? ?Social History  ? ?Socioeconomic History  ? Marital status: Single  ?  Spouse name: Not on file  ? Number of children: 3  ? Years of education: HS  ? Highest education level: Not on file  ?Occupational History  ?  Comment: Unemployed  ?Tobacco Use  ? Smoking status: Never  ? Smokeless tobacco: Never  ?Vaping Use  ? Vaping Use: Never used  ?Substance and Sexual Activity  ? Alcohol use: No  ?  Comment: Quit: 2012  ? Drug use: No  ? Sexual activity: Not on file  ?Other Topics Concern  ? Not on file  ?Social History Narrative  ? Pt lives at home with his spouse.  ? Caffeine Use: very little  ? Right handed   ? ?Social Determinants of Health  ? ?Financial Resource Strain: Not on file  ?Food Insecurity: Not on file  ?Transportation Needs: Not on file  ?Physical Activity: Not on file  ?Stress: Not on file  ?Social Connections: Not on file  ? ?Outpatient Encounter Medications as of 11/13/2021  ?Medication Sig  ? atorvastatin (LIPITOR) 20 MG tablet Take 1 tablet (20 mg total) by mouth daily.  ? cholecalciferol (VITAMIN D3) 25 MCG (1000 UNIT) tablet Take 1,000 Units by mouth daily.  ? divalproex (DEPAKOTE) 500 MG DR tablet 2 tablets in the morning, 3 in the evening  ? gabapentin (NEURONTIN) 600 MG tablet Take 1 tablet (600 mg total) by mouth at bedtime.  ? glucose blood test strip Use as instructed to  test blood glucose two times daily. Once before breakfast and again at bedtime.  ? hydrochlorothiazide (HYDRODIURIL) 25 MG tablet Take 1 tablet by mouth once daily  ? levETIRAcetam (KEPPRA) 750 MG tablet Take 2 tablets twice daily  ? losartan-hydrochlorothiazide (HYZAAR) 100-25 MG tablet Take 1 tablet by mouth daily.  ? metFORMIN (GLUCOPHAGE) 1000 MG tablet Take 1 tablet (1,000 mg total) by mouth 2 (two) times daily with a meal.  ? Siponimod Fumarate (MAYZENT) 2 MG TABS Take 1 tablet by mouth daily.  ? Vitamin D, Ergocalciferol, (DRISDOL) 1.25 MG (50000 UNIT) CAPS capsule Take 1 capsule (50,000 Units total) by mouth every 7 (seven) days.  ? [DISCONTINUED] glipiZIDE (GLUCOTROL XL) 5 MG 24 hr tablet TAKE 2 TABLETS BY MOUTH ONCE DAILY WITH BREAKFAST  ? glipiZIDE (GLUCOTROL XL) 5 MG 24 hr tablet Take 2 tablets (10 mg total) by mouth daily with breakfast. TAKE 2 TABLETS BY MOUTH ONCE DAILY WITH BREAKFAST  ? ?No facility-administered encounter medications on file as of 11/13/2021.  ? ? ?  ALLERGIES: ?Allergies  ?Allergen Reactions  ? Benazepril   ?  States that it made his mouth burn  ? Shellfish Allergy   ? ? ?VACCINATION STATUS: ? ?There is no immunization history on file for this patient. ? ?Diabetes ?He presents for his follow-up diabetic visit. He has type 2 diabetes mellitus. Onset time: He was diagnosed at approximate age of 89 years. His disease course has been improving. There are no hypoglycemic associated symptoms. Pertinent negatives for hypoglycemia include no confusion, headaches, pallor or seizures. Associated symptoms include polydipsia and polyuria. Pertinent negatives for diabetes include no chest pain, no fatigue, no polyphagia and no weakness. There are no hypoglycemic complications. Symptoms are resolved. There are no diabetic complications. Risk factors for coronary artery disease include diabetes mellitus, male sex, obesity and sedentary lifestyle. Current diabetic treatment includes oral agent (dual  therapy). He is compliant with treatment most of the time. His weight is increasing steadily. He is following a generally healthy diet. When asked about meal planning, he reported none. He has not had a previous visit with a dietitian. He participates in exercise intermittently. His home blood glucose trend is decreasing steadily. (He presents today with his meter, no logs, showing inconsistent glucose monitoring (recently ran out of strips but is picking up prescription refill today).  His POCT A1c today is 9.1%, improving from previous visit of 12.2%.  He denies any hypoglycemia. ?) An ACE inhibitor/angiotensin II receptor blocker is being taken. He does not see a podiatrist.Eye exam is current.  ?Hyperlipidemia ?This is a chronic problem. The current episode started more than 1 year ago. The problem is uncontrolled. Recent lipid tests were reviewed and are high. Exacerbating diseases include diabetes. Factors aggravating his hyperlipidemia include thiazides. Pertinent negatives include no chest pain. Current antihyperlipidemic treatment includes statins. The current treatment provides mild improvement of lipids. There are no compliance problems.  Risk factors for coronary artery disease include diabetes mellitus, dyslipidemia, male sex, obesity, a sedentary lifestyle and family history.  ?Hypertension ?This is a chronic problem. The current episode started more than 1 year ago. The problem is unchanged. The problem is controlled. Pertinent negatives include no chest pain or headaches. There are no associated agents to hypertension. Risk factors for coronary artery disease include diabetes mellitus, dyslipidemia, family history and male gender. Past treatments include angiotensin blockers and diuretics. The current treatment provides mild improvement. There are no compliance problems.   ? ? ?Review of systems ? ?Constitutional: + steadily increasing body weight,  current Body mass index is 32.39 kg/m?. , no  fatigue, no subjective hyperthermia, no subjective hypothermia ?Eyes: no blurry vision, no xerophthalmia ?ENT: no sore throat, no nodules palpated in throat, no dysphagia/odynophagia, no hoarseness ?Cardiovascular: no chest pain, no shortness of breath, no palpitations, no leg swelling ?Respiratory: no cough, no shortness of breath ?Gastrointestinal: no nausea/vomiting/diarrhea ?Musculoskeletal: no muscle/joint aches ?Skin: no rashes, no hyperemia ?Neurological: no tremors, + numbness/tingling to BLE, no dizziness ?Psychiatric: no depression, no anxiety ? ? ?Objective:  ?  ?BP 107/71   Pulse 72   Ht $R'5\' 7"'Qq$  (1.702 m)   Wt 206 lb 12.8 oz (93.8 kg)   SpO2 97%   BMI 32.39 kg/m?   ?Wt Readings from Last 3 Encounters:  ?11/13/21 206 lb 12.8 oz (93.8 kg)  ?08/15/21 199 lb (90.3 kg)  ?03/28/21 195 lb (88.5 kg)  ?  ?BP Readings from Last 3 Encounters:  ?11/13/21 107/71  ?08/15/21 111/67  ?03/28/21 108/80  ? ? ? ?Physical Exam-  Limited ? ?Constitutional:  Body mass index is 32.39 kg/m?. , not in acute distress, normal state of mind ?Eyes:  EOMI, no exophthalmos ?Neck: Supple ?Cardiovascular: RRR, no murmurs, rubs, or gallops, no edema ?Respiratory: Adequate breathing efforts, no crackles, rales, rhonchi, or wheezing ?Musculoskeletal: no gross deformities, strength intact in all four extremities, no gross restriction of joint movements ?Skin:  no rashes, no hyperemia ?Neurological: no tremor with outstretched hands ? ? ? ?Recent Results (from the past 2160 hour(s))  ?POCT glycosylated hemoglobin (Hb A1C)     Status: Abnormal  ? Collection Time: 08/15/21  1:42 PM  ?Result Value Ref Range  ? Hemoglobin A1C    ? HbA1c POC (<> result, manual entry)    ? HbA1c, POC (prediabetic range)    ? HbA1c, POC (controlled diabetic range) 12.2 (A) 0.0 - 7.0 %  ?Comprehensive metabolic panel     Status: Abnormal  ? Collection Time: 11/12/21 10:37 AM  ?Result Value Ref Range  ? Glucose 133 (H) 70 - 99 mg/dL  ? BUN 16 6 - 20 mg/dL  ?  Creatinine, Ser 0.96 0.76 - 1.27 mg/dL  ? eGFR 104 >59 mL/min/1.73  ? BUN/Creatinine Ratio 17 9 - 20  ? Sodium 140 134 - 144 mmol/L  ? Potassium 4.3 3.5 - 5.2 mmol/L  ? Chloride 99 96 - 106 mmol/L  ? CO2 23 20 - 29 mmol

## 2021-12-26 IMAGING — CR DG KNEE 1-2V*L*
2 series · 2 of 2 positions shown · non-contrast
Comparison: None.

CLINICAL DATA: Chronic left knee pain.  No injury.

EXAM:
LEFT KNEE - 1-2 VIEW

[w knee ap left]
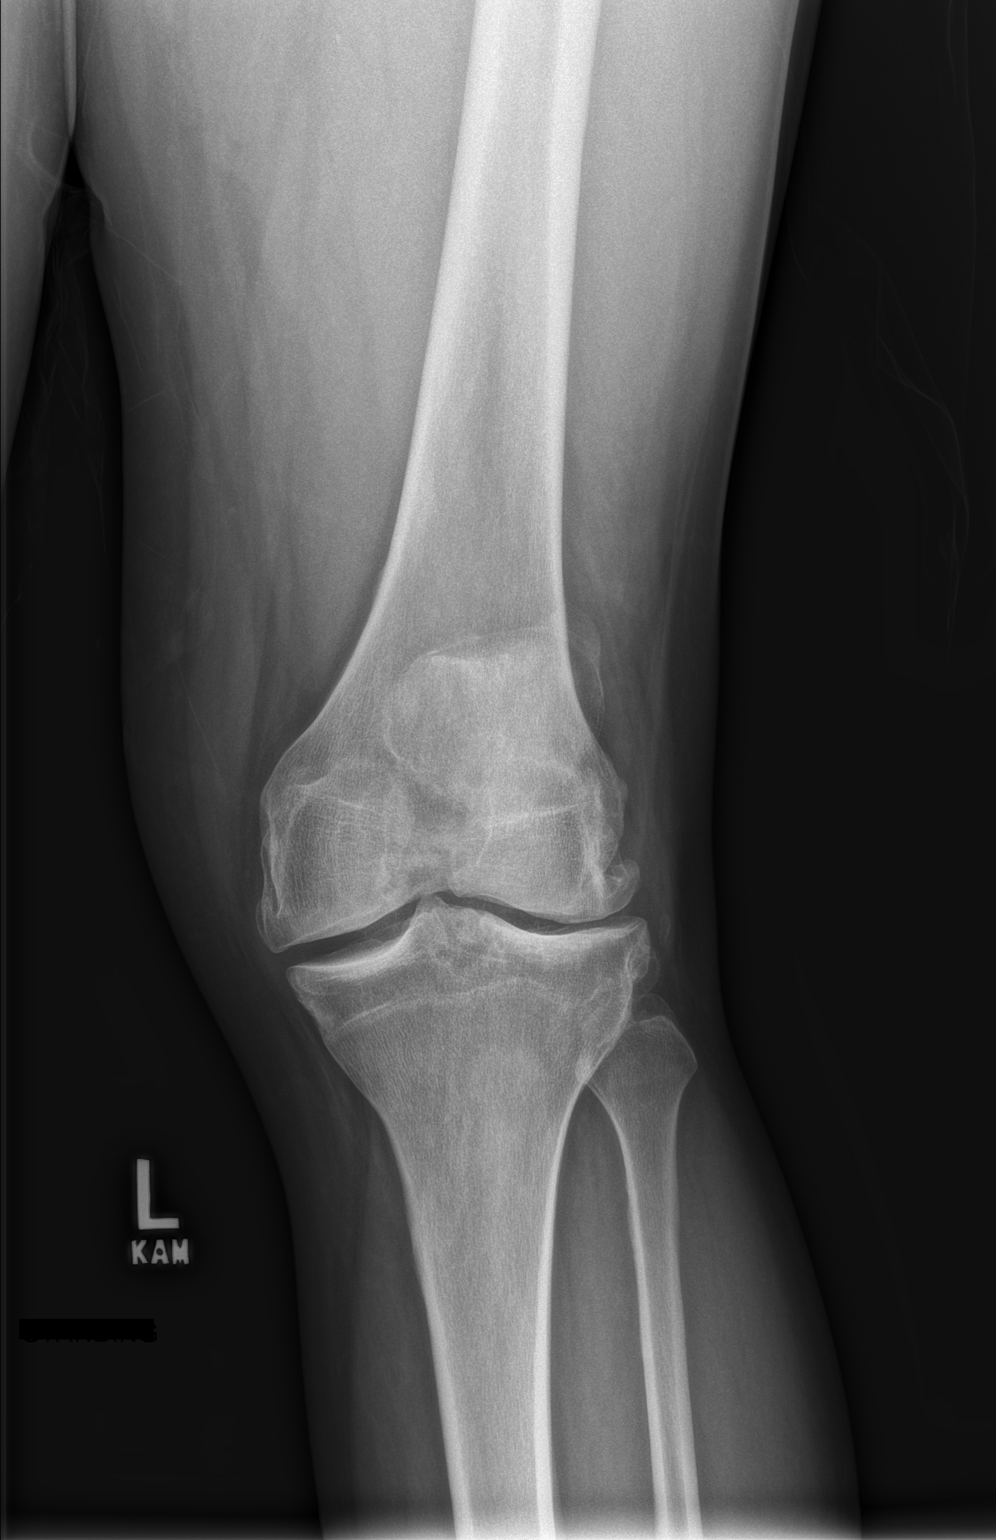

[w knee lat left]
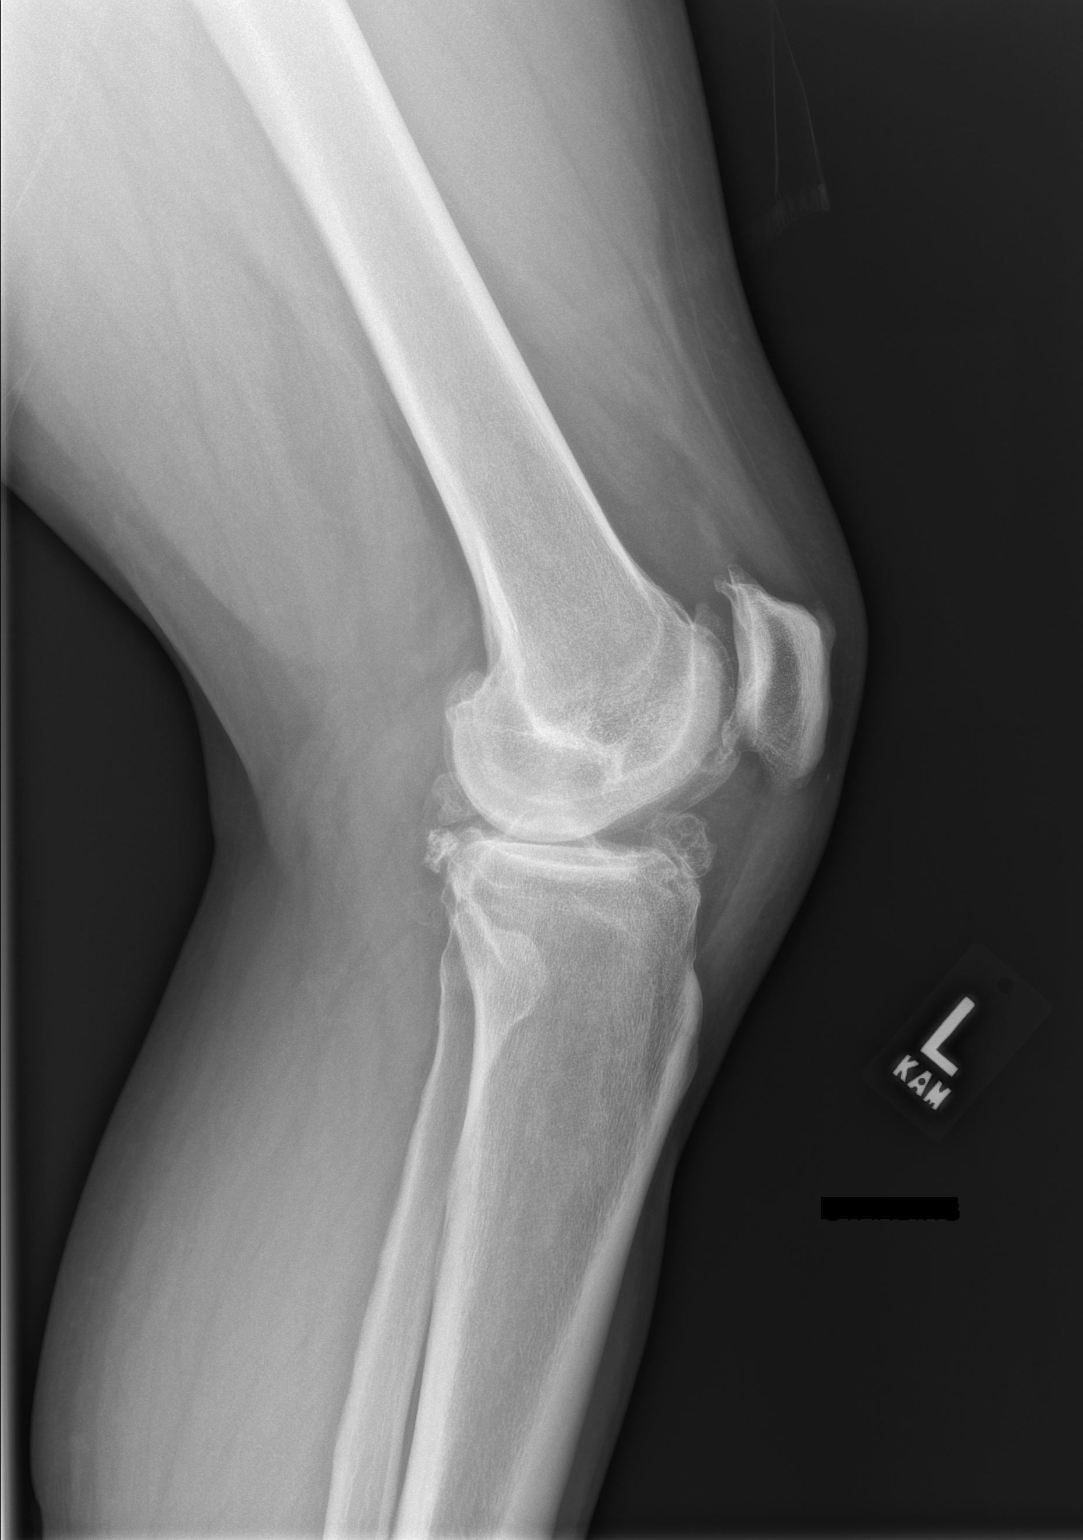

[2 of 2 positions shown; findings below may reference images not displayed]

FINDINGS: Moderate tricompartmental osteoarthritis is present. No acute
fracture or dislocation. No definite joint effusion. Suggestion of
several possible loose bodies on the lateral film.
IMPRESSION: 1. No acute findings.
2. Moderate tricompartmental osteoarthritis.  Possible loose bodies.

## 2022-01-31 ENCOUNTER — Other Ambulatory Visit: Payer: Self-pay | Admitting: Nurse Practitioner

## 2022-02-03 ENCOUNTER — Other Ambulatory Visit: Payer: Self-pay | Admitting: Neurology

## 2022-02-18 ENCOUNTER — Ambulatory Visit: Payer: Medicaid Other | Admitting: Nurse Practitioner

## 2022-02-18 ENCOUNTER — Encounter: Payer: Self-pay | Admitting: Nurse Practitioner

## 2022-02-18 ENCOUNTER — Ambulatory Visit (INDEPENDENT_AMBULATORY_CARE_PROVIDER_SITE_OTHER): Payer: Medicaid Other | Admitting: Nurse Practitioner

## 2022-02-18 VITALS — BP 130/82 | HR 52 | Ht 67.0 in | Wt 211.0 lb

## 2022-02-18 DIAGNOSIS — E1165 Type 2 diabetes mellitus with hyperglycemia: Secondary | ICD-10-CM | POA: Diagnosis not present

## 2022-02-18 LAB — POCT GLYCOSYLATED HEMOGLOBIN (HGB A1C): HbA1c, POC (controlled diabetic range): 9.5 % — AB (ref 0.0–7.0)

## 2022-02-18 LAB — POCT UA - MICROALBUMIN
Creatinine, POC: 300 mg/dL
Microalbumin Ur, POC: 80 mg/L

## 2022-02-18 MED ORDER — ACCU-CHEK GUIDE VI STRP: ORAL_STRIP | 12 refills | Status: DC

## 2022-02-18 MED ORDER — LOSARTAN POTASSIUM-HCTZ 100-25 MG PO TABS: 1.0000 | ORAL_TABLET | Freq: Every day | ORAL | 3 refills | Status: AC

## 2022-02-18 MED ORDER — GLIPIZIDE ER 5 MG PO TB24
10.0000 mg | ORAL_TABLET | Freq: Every day | ORAL | 3 refills | Status: DC
Start: 2022-02-18 — End: 2022-05-21

## 2022-02-18 MED ORDER — VITAMIN D (ERGOCALCIFEROL) 1.25 MG (50000 UNIT) PO CAPS
50000.0000 [IU] | ORAL_CAPSULE | ORAL | 0 refills | Status: DC
Start: 2022-02-18 — End: 2022-08-21

## 2022-02-18 MED ORDER — ACCU-CHEK GUIDE W/DEVICE KIT: PACK | 0 refills | Status: AC

## 2022-02-18 MED ORDER — METFORMIN HCL 1000 MG PO TABS: 1000.0000 mg | ORAL_TABLET | Freq: Two times a day (BID) | ORAL | 3 refills | Status: DC

## 2022-02-18 NOTE — Progress Notes (Signed)
02/18/2022, 4:16 PM   Endocrinology follow-up note   Subjective:    Patient ID: Martin Stevenson, male    DOB: 1986-03-15.  Martin Stevenson is being seen in follow-up  for management of currently uncontrolled symptomatic diabetes, hyperlipidemia, hypertension.  PMD:  Carrolyn Meiers, MD.   Past Medical History:  Diagnosis Date   Diabetes [facility])    Fluid retention    Hypertension    Multiple sclerosis ([place]) 12/29/2017   Seizures ([place])    Past Surgical History:  Procedure Laterality Date   MOUTH SURGERY     x3, jaw, hip, chin   Social History   Socioeconomic History   Marital status: Single    Spouse name: Not on file   Number of children: 3   Years of education: HS   Highest education level: Not on file  Occupational History    Comment: Unemployed  Tobacco Use   Smoking status: Never   Smokeless tobacco: Never  Vaping Use   Vaping Use: Never used  Substance and Sexual Activity   Alcohol use: No    Comment: Quit: 2012   Drug use: No   Sexual activity: Not on file  Other Topics Concern   Not on file  Social History Narrative   Pt lives at home with his spouse.   Caffeine Use: very little   Right handed    Social Determinants of Health   Financial Resource Strain: Not on file  Food Insecurity: Not on file  Transportation Needs: Not on file  Physical Activity: Not on file  Stress: Not on file  Social Connections: Not on file   Outpatient Encounter Medications as of 02/18/2022  Medication Sig   Blood Glucose Monitoring Suppl (ACCU-CHEK GUIDE) w/Device KIT Use to monitor glucose once daily before breakfast   glucose blood (ACCU-CHEK GUIDE) test strip Use as instructed to monitor glucose once daily before breakfast.   atorvastatin (LIPITOR) 20 MG tablet Take 1 tablet (20 mg total) by mouth daily.   cholecalciferol (VITAMIN D3) 25 MCG (1000 UNIT) tablet Take 1,000 Units by mouth  daily.   divalproex (DEPAKOTE) 500 MG DR tablet 2 tablets in the morning, 3 in the evening   gabapentin (NEURONTIN) 600 MG tablet TAKE 1 TABLET BY MOUTH AT BEDTIME   glipiZIDE (GLUCOTROL XL) 5 MG 24 hr tablet Take 2 tablets (10 mg total) by mouth daily with breakfast. TAKE 2 TABLETS BY MOUTH ONCE DAILY WITH BREAKFAST   levETIRAcetam (KEPPRA) 750 MG tablet Take 2 tablets twice daily   losartan-hydrochlorothiazide (HYZAAR) 100-25 MG tablet Take 1 tablet by mouth daily.   metFORMIN (GLUCOPHAGE) 1000 MG tablet Take 1 tablet (1,000 mg total) by mouth 2 (two) times daily with a meal.   Siponimod Fumarate (MAYZENT) 2 MG TABS Take 1 tablet by mouth daily. (Patient not taking: Reported on 02/18/2022)   Vitamin D, Ergocalciferol, (DRISDOL) 1.25 MG (50000 UNIT) CAPS capsule Take 1 capsule (50,000 Units total) by mouth once a week.   [DISCONTINUED] glipiZIDE (GLUCOTROL XL) 5 MG 24 hr tablet Take 2 tablets (10 mg total) by mouth daily with breakfast. TAKE 2 TABLETS BY MOUTH ONCE DAILY WITH BREAKFAST   [  DISCONTINUED] glucose blood test strip Use as instructed to test blood glucose two times daily. Once before breakfast and again at bedtime.   [DISCONTINUED] hydrochlorothiazide (HYDRODIURIL) 25 MG tablet Take 1 tablet by mouth once daily   [DISCONTINUED] losartan-hydrochlorothiazide (HYZAAR) 100-25 MG tablet Take 1 tablet by mouth daily.   [DISCONTINUED] metFORMIN (GLUCOPHAGE) 1000 MG tablet Take 1 tablet (1,000 mg total) by mouth 2 (two) times daily with a meal.   [DISCONTINUED] Vitamin D, Ergocalciferol, (DRISDOL) 1.25 MG (50000 UNIT) CAPS capsule Take 1 capsule by mouth once a week   No facility-administered encounter medications on file as of 02/18/2022.    ALLERGIES: Allergies  Allergen Reactions   Benazepril     States that it made his mouth burn   Shellfish Allergy     VACCINATION STATUS:  There is no immunization history on file for this patient.  Diabetes He presents for his follow-up  diabetic visit. He has type 2 diabetes mellitus. Onset time: He was diagnosed at approximate age of 65 years. His disease course has been improving. There are no hypoglycemic associated symptoms. Pertinent negatives for hypoglycemia include no confusion, headaches, pallor or seizures. Pertinent negatives for diabetes include no chest pain, no fatigue, no polydipsia, no polyphagia, no polyuria and no weakness. There are no hypoglycemic complications. Symptoms are stable. There are no diabetic complications. Risk factors for coronary artery disease include diabetes mellitus, male sex, obesity and sedentary lifestyle. Current diabetic treatment includes oral agent (dual therapy). He is compliant with treatment most of the time. His weight is increasing steadily. He is following a generally unhealthy diet. When asked about meal planning, he reported none. He has not had a previous visit with a dietitian. He participates in exercise intermittently. His overall blood glucose range is 180-200 mg/dl. (He presents today with his meter (nearly broken, batteries held on by duct tape), no logs, showing inconsistent glucose monitoring and gross hyperglycemia overall.  His POCT A1c today is 9.5%, increasing from last visit of 9.1%.  He reports he has been out of his Glipizide for about a week.  Analysis of his meter shows 7-day average of 194 (3 readings); 14-day average of 160 (11 readings); 30-day average of 194 (22 readings); 90-day average of 204 (78 readings). ) An ACE inhibitor/angiotensin II receptor blocker is being taken. He does not see a podiatrist.Eye exam is current.  Hyperlipidemia This is a chronic problem. The current episode started more than 1 year ago. The problem is controlled. Recent lipid tests were reviewed and are normal. Exacerbating diseases include diabetes. Factors aggravating his hyperlipidemia include thiazides. Pertinent negatives include no chest pain. Current antihyperlipidemic treatment  includes statins. The current treatment provides mild improvement of lipids. There are no compliance problems.  Risk factors for coronary artery disease include diabetes mellitus, dyslipidemia, male sex, obesity, a sedentary lifestyle and family history.  Hypertension This is a chronic problem. The current episode started more than 1 year ago. The problem is unchanged. The problem is controlled. Pertinent negatives include no chest pain or headaches. There are no associated agents to hypertension. Risk factors for coronary artery disease include diabetes mellitus, dyslipidemia, family history and male gender. Past treatments include angiotensin blockers and diuretics. The current treatment provides mild improvement. There are no compliance problems.      Review of systems  Constitutional: + steadily increasing body weight,  current Body mass index is 33.05 kg/m., no fatigue, no subjective hyperthermia, no subjective hypothermia Eyes: no blurry vision, no xerophthalmia ENT: no  sore throat, no nodules palpated in throat, no dysphagia/odynophagia, no hoarseness Cardiovascular: no chest pain, no shortness of breath, no palpitations, no leg swelling Respiratory: no cough, no shortness of breath Gastrointestinal: no nausea/vomiting/diarrhea Musculoskeletal: no muscle/joint aches Skin: no rashes, no hyperemia Neurological: no tremors, + numbness/tingling to BLE, no dizziness Psychiatric: no depression, no anxiety   Objective:    BP 130/82   Pulse (!) 52   Ht _0  (1.702 m)   Wt 211 lb (95.7 kg)   BMI 33.05 kg/m   Wt Readings from Last 3 Encounters:  02/18/22 211 lb (95.7 kg)  11/13/21 206 lb 12.8 oz (93.8 kg)  08/15/21 199 lb (90.3 kg)    BP Readings from Last 3 Encounters:  02/18/22 130/82  11/13/21 107/71  08/15/21 111/67     Physical Exam- Limited  Constitutional:  Body mass index is 33.05 kg/m. , not in acute distress, normal state of mind Eyes:  EOMI, no  exophthalmos Neck: Supple Cardiovascular: RRR, no murmurs, rubs, or gallops, no edema Respiratory: Adequate breathing efforts, no crackles, rales, rhonchi, or wheezing Musculoskeletal: no gross deformities, strength intact in all four extremities, no gross restriction of joint movements Skin:  no rashes, no hyperemia Neurological: no tremor with outstretched hands    Recent Results (from the past 2160 hour(s))  POCT UA - Microalbumin     Status: None   Collection Time: 02/18/22  3:49 PM  Result Value Ref Range   Microalbumin Ur, POC 80 mg/L   Creatinine, POC 300 mg/dL   Albumin/Creatinine Ratio, Urine, POC 30-300   HgB A1c     Status: Abnormal   Collection Time: 02/18/22  3:50 PM  Result Value Ref Range   Hemoglobin A1C     HbA1c POC (<> result, manual entry)     HbA1c, POC (prediabetic range)     HbA1c, POC (controlled diabetic range) 9.5 (A) 0.0 - 7.0 %        Latest Ref Rng & Units 11/12/2021   10:37 AM 03/12/2021   11:20 AM 02/08/2021   12:00 AM  CMP  Glucose 70 - 99 mg/dL 133  299    BUN 6 - 20 mg/dL _1 Creatinine 0.76 - 1.27 mg/dL 0.96  0.89  0.9   Sodium 134 - 144 mmol/L 140  136  140   Potassium 3.5 - 5.2 mmol/L 4.3  4.0  4.4   Chloride 96 - 106 mmol/L 99  98  105   CO2 20 - 29 mmol/L _2 Calcium 8.7 - 10.2 mg/dL 10.0  9.9  8.8   Total Protein 6.0 - 8.5 g/dL 6.9  7.1    Total Bilirubin 0.0 - 1.2 mg/dL 0.3  0.5    Alkaline Phos 44 - 121 IU/L 43   36   AST 0 - 40 IU/L _3 ALT 0 - 44 IU/L _4 Lipid Panel     Component Value Date/Time   CHOL 161 11/12/2021 1038   TRIG 88 11/12/2021 1038   HDL 47 11/12/2021 1038   CHOLHDL 3.4 11/12/2021 1038   CHOLHDL 5.1 (H) 03/12/2021 1120   LDLCALC 98 11/12/2021 1038   LDLCALC 137 (H) 03/12/2021 1120   LABVLDL 16 11/12/2021 1038    Assessment & Plan:   1) Uncontrolled type 2 diabetes mellitus with hyperglycemia (HCC)  - Arjay Orton has currently uncontrolled symptomatic type  2 DM  since 36 years of age.  He presents today with his meter (nearly broken, batteries held on by duct tape), no logs, showing inconsistent glucose monitoring and gross hyperglycemia overall.  His POCT A1c today is 9.5%, increasing from last visit of 9.1%.  He reports he has been out of his Glipizide for about a week.  Analysis of his meter shows 7-day average of 194 (3 readings); 14-day average of 160 (11 readings); 30-day average of 194 (22 readings); 90-day average of 204 (78 readings).  -He does not report any gross complications from his diabetes, however patient with multiple sclerosis as well as seizure disorders,  and he remains at a high risk for more acute and chronic complications which include CAD, CVA, CKD, retinopathy, and neuropathy. These are all discussed in detail with him.  - Nutritional counseling repeated at each appointment due to patients tendency to fall back in to old habits.  - The patient admits there is a room for improvement in their diet and drink choices. -  Suggestion is made for the patient to avoid simple carbohydrates from their diet including Cakes, Sweet Desserts / Pastries, Ice Cream, Soda (diet and regular), Sweet Tea, Candies, Chips, Cookies, Sweet Pastries, Store Bought Juices, Alcohol in Excess of 1-2 drinks a day, Artificial Sweeteners, Coffee Creamer, and "Sugar-free" Products. This will help patient to have stable blood glucose profile and potentially avoid unintended weight gain.   - I encouraged the patient to switch to unprocessed or minimally processed complex starch and increased protein intake (animal or plant source), fruits, and vegetables.   - Patient is advised to stick to a routine mealtimes to eat 3 meals a day and avoid unnecessary snacks (to snack only to correct hypoglycemia).  -Although his A1c is worse today, he promises to do better and prefers not to add any medications.  He is advised to continue Metformin 1000 mg po twice daily with meals  and Glipizide 10 mg XL daily with breakfast.   I discussed the potential of adding GLP1 to assist in weight loss efforts and provide cardiovascular protection but he politely declined.  -He is willing to continue monitoring blood glucose at least once a day-daily before breakfast and at any other time as needed and to call the clinic if he has readings less than 70 or greater than 200 for 3 tests in a row.  I did send in a new meter for him today given his is broken.  - Patient specific target  A1c;  LDL, HDL, Triglycerides,  were discussed in detail.  2) BP/HTN:  His blood pressure is controlled to target.  He is allergic to ACE inhibitors.  He is advised to restart Losartan- HCT 100-25 mg po daily.   3) Lipids/HPL:  His most recent lipid panel from 11/12/21 shows controlled LDL of 98.   He is advised to continue his Lipitor 20 mg po daily at bedtime.  Side effects and precautions discussed with him.  4)  Weight/Diet:  His Body mass index is 33.05 kg/m.  I discussed with him the fact that loss of 5 - 10% of his  current body weight will have the most impact on his diabetes management.  CDE Consult will be initiated . Exercise, and detailed carbohydrates information provided  -  detailed on discharge instructions.  5) Vitamin D deficiency:  His most recent vitamin D level was 18.3 on 11/12/21.  He is taking OTC Vitamin D3 5000 units daily. He has been  on Ergocalciferol 50000 units PO weekly in the past but will reinitiate this as he could use boost in his levels.  6) Chronic Care/Health Maintenance: -he is on ARB and statin medications and is encouraged to initiate and continue to follow up with Ophthalmology, Dentist,  Podiatrist at least yearly or according to recommendations, and advised to stay away from smoking. I have recommended yearly flu vaccine and pneumonia vaccine at least every 5 years; moderate intensity exercise for up to 150 minutes weekly; and  sleep for at least 7 hours a day.  -  I advised patient to maintain close follow up with Carrolyn Meiers, MD for primary care needs.     I spent 38 minutes in the care of the patient today including review of labs from [place], Lipids, Thyroid Function, Hematology (current and previous including abstractions from other facilities); face-to-face time discussing  his blood glucose readings/logs, discussing hypoglycemia and hyperglycemia episodes and symptoms, medications doses, his options of short and long term treatment based on the latest standards of care / guidelines;  discussion about incorporating lifestyle medicine;  and documenting the encounter. Risk reduction counseling performed per USPSTF guidelines to reduce obesity and cardiovascular risk factors.     Please refer to Patient Instructions for Blood Glucose Monitoring and Insulin/Medications Dosing Guide"  in media tab for additional information. Please  also refer to " Patient Self Inventory" in the Media  tab for reviewed elements of pertinent patient history.  Ying Whitson participated in the discussions, expressed understanding, and voiced agreement with the above plans.  All questions were answered to his satisfaction. he is encouraged to contact clinic should he have any questions or concerns prior to his return visit.   Follow up plan: - Return in about 3 months (around 05/21/2022) for Diabetes F/U with A1c in office, No previsit labs, Bring meter and logs.  Rayetta Pigg, [facility] [address] [place], [place] [address] Phone: [phone] Fax: [phone]  02/18/2022, 4:16 PM

## 2022-03-12 NOTE — Progress Notes (Unsigned)
Patient: Martin Stevenson Date of Birth: Feb 03, 1986  Reason for Visit: Follow up History from: Patient Primary Neurologist: Martin Stevenson    ASSESSMENT AND PLAN 36 y.o. year old male   66.  Multiple sclerosis 2.  Intractable seizures   HISTORY OF PRESENT ILLNESS: Today 03/12/22 Martin Stevenson is here today for follow-up.  Remains on Mayzent.  MRI of the brain with and without contrast in August 2022 was overall stable, no new lesions from March 2021.  Remains on Keppra and Depakote for seizures   HISTORY  03/11/2021 SS: Martin Stevenson is a 36 year old right-handed black male with a history of multiple sclerosis and a history of intractable seizures.  The patient generally has several seizures a month, he has gone 6 weeks since his last seizure event.  He remains on Keppra and Depakote.  He does not operate a Martin Stevenson, he lives in the [place], [place] area.  He has a history of diabetes as well.  He has done well with his MS symptoms, he has not had any new symptoms since last seen.  He remains on Mayzent.  His last MRI was done a year and a half ago and showed a new brainstem lesion.  He was switched over to [place] at that time.  The patient reports that he has left knee discomfort that has been present for least several months.  He has increased discomfort if he is sitting for a long period time and then tries to walk.  The knee may bother him at nighttime as well when he is trying to sleep.  His most recent hemoglobin A1c was 9.0.  He returns for further evaluation.  REVIEW OF SYSTEMS: Out of a complete 14 system review of symptoms, the patient complains only of the following symptoms, and all other reviewed systems are negative.  See HPI  ALLERGIES: Allergies  Allergen Reactions   Benazepril     States that it made his mouth burn   Shellfish Allergy     HOME MEDICATIONS: Outpatient Medications Prior to Visit  Medication Sig Dispense Refill   atorvastatin (LIPITOR) 20 MG  tablet Take 1 tablet (20 mg total) by mouth daily. 90 tablet 3   Blood Glucose Monitoring Suppl (ACCU-CHEK GUIDE) w/Device KIT Use to monitor glucose once daily before breakfast 1 kit 0   cholecalciferol (VITAMIN D3) 25 MCG (1000 UNIT) tablet Take 1,000 Units by mouth daily.     divalproex (DEPAKOTE) 500 MG DR tablet 2 tablets in the morning, 3 in the evening 450 tablet 3   gabapentin (NEURONTIN) 600 MG tablet TAKE 1 TABLET BY MOUTH AT BEDTIME 90 tablet 0   glipiZIDE (GLUCOTROL XL) 5 MG 24 hr tablet Take 2 tablets (10 mg total) by mouth daily with breakfast. TAKE 2 TABLETS BY MOUTH ONCE DAILY WITH BREAKFAST 180 tablet 3   glucose blood (ACCU-CHEK GUIDE) test strip Use as instructed to monitor glucose once daily before breakfast. 100 each 12   levETIRAcetam (KEPPRA) 750 MG tablet Take 2 tablets twice daily 360 tablet 3   losartan-hydrochlorothiazide (HYZAAR) 100-25 MG tablet Take 1 tablet by mouth daily. 90 tablet 3   metFORMIN (GLUCOPHAGE) 1000 MG tablet Take 1 tablet (1,000 mg total) by mouth 2 (two) times daily with a meal. 180 tablet 3   Siponimod Fumarate (MAYZENT) 2 MG TABS Take 1 tablet by mouth daily. (Patient not taking: Reported on 02/18/2022) 30 tablet 11   Vitamin D, Ergocalciferol, (DRISDOL) 1.25 MG (50000 UNIT) CAPS capsule Take 1  capsule (50,000 Units total) by mouth once a week. 12 capsule 0   No facility-administered medications prior to visit.    PAST MEDICAL HISTORY: Past Medical History:  Diagnosis Date   Diabetes ([place])    Fluid retention    Hypertension    Multiple sclerosis ([place]) 12/29/2017   Seizures ([place])     PAST SURGICAL HISTORY: Past Surgical History:  Procedure Laterality Date   MOUTH SURGERY     x3, jaw, hip, chin    FAMILY HISTORY: Family History  Problem Relation Age of Onset   Diabetes Mother    Pulmonary embolism Mother        hx of blood clots   Diabetes Father     SOCIAL HISTORY: Social History   Socioeconomic History   Marital status:  Single    Spouse name: Not on file   Number of children: 3   Years of education: HS   Highest education level: Not on file  Occupational History    Comment: Unemployed  Tobacco Use   Smoking status: Never   Smokeless tobacco: Never  Vaping Use   Vaping Use: Never used  Substance and Sexual Activity   Alcohol use: No    Comment: Quit: 2012   Drug use: No   Sexual activity: Not on file  Other Topics Concern   Not on file  Social History Narrative   Pt lives at home with his spouse.   Caffeine Use: very little   Right handed    Social Determinants of Health   Financial Resource Strain: Not on file  Food Insecurity: Not on file  Transportation Needs: Not on file  Physical Activity: Not on file  Stress: Not on file  Social Connections: Not on file  Intimate Partner Violence: Not on file    PHYSICAL EXAM  There were no vitals filed for this visit. There is no height or weight on file to calculate BMI.  Generalized: Well developed, in no acute distress  Neurological examination  Mentation: Alert oriented to time, place, history taking. Follows all commands speech and language fluent Cranial nerve II-XII: Pupils were equal round reactive to light. Extraocular movements were full, visual field were full on confrontational test. Facial sensation and strength were normal. Uvula tongue midline. Head turning and shoulder shrug  were normal and symmetric. Motor: The motor testing reveals 5 over 5 strength of all 4 extremities. Good symmetric motor tone is noted throughout.  Sensory: Sensory testing is intact to soft touch on all 4 extremities. No evidence of extinction is noted.  Coordination: Cerebellar testing reveals good finger-nose-finger and heel-to-shin bilaterally.  Gait and station: Gait is normal. Tandem gait is normal. Romberg is negative. No drift is seen.  Reflexes: Deep tendon reflexes are symmetric and normal bilaterally.   DIAGNOSTIC DATA (LABS, IMAGING,  TESTING) - I reviewed patient records, labs, notes, testing and imaging myself where available.  Lab Results  Component Value Date   WBC 2.3 (LL) 03/11/2021   HGB 14.4 03/11/2021   HCT 45.2 03/11/2021   MCV 89 03/11/2021   PLT 188 03/11/2021      Component Value Date/Time   NA 140 11/12/2021 1037   K 4.3 11/12/2021 1037   CL 99 11/12/2021 1037   CO2 23 11/12/2021 1037   GLUCOSE 133 (H) 11/12/2021 1037   GLUCOSE 299 (H) 03/12/2021 1120   BUN 16 11/12/2021 1037   CREATININE 0.96 11/12/2021 1037   CREATININE 0.89 03/12/2021 1120   CALCIUM 10.0 11/12/2021 1037  PROT 6.9 11/12/2021 1037   ALBUMIN 4.4 11/12/2021 1037   AST 11 11/12/2021 1037   ALT 9 11/12/2021 1037   ALKPHOS 43 (L) 11/12/2021 1037   BILITOT 0.3 11/12/2021 1037   GFRNONAA 108 09/10/2020 1016   GFRNONAA 110 12/21/2019 0830   GFRAA 125 09/10/2020 1016   GFRAA 128 12/21/2019 0830   Lab Results  Component Value Date   CHOL 161 11/12/2021   HDL 47 11/12/2021   LDLCALC 98 11/12/2021   TRIG 88 11/12/2021   CHOLHDL 3.4 11/12/2021   Lab Results  Component Value Date   HGBA1C 9.5 (A) 02/18/2022   No results found for: "VITAMINB12" Lab Results  Component Value Date   TSH 2.550 11/12/2021    Butler Denmark, AGNP-C, DNP 03/12/2022, 2:38 PM [facility] [address], [place], [place] [address] [phone]

## 2022-03-13 ENCOUNTER — Encounter: Payer: Self-pay | Admitting: Neurology

## 2022-03-13 ENCOUNTER — Ambulatory Visit (INDEPENDENT_AMBULATORY_CARE_PROVIDER_SITE_OTHER): Payer: Medicaid Other | Admitting: Neurology

## 2022-03-13 VITALS — BP 120/78 | HR 79 | Ht 67.0 in | Wt 214.0 lb

## 2022-03-13 DIAGNOSIS — G35 Multiple sclerosis: Secondary | ICD-10-CM

## 2022-03-13 DIAGNOSIS — R569 Unspecified convulsions: Secondary | ICD-10-CM

## 2022-03-13 DIAGNOSIS — Z5181 Encounter for therapeutic drug level monitoring: Secondary | ICD-10-CM

## 2022-03-13 MED ORDER — LEVETIRACETAM 750 MG PO TABS
ORAL_TABLET | ORAL | 3 refills | Status: DC
Start: 2022-03-13 — End: 2022-07-22

## 2022-03-13 MED ORDER — GABAPENTIN 600 MG PO TABS
600.0000 mg | ORAL_TABLET | Freq: Every day | ORAL | 1 refills | Status: DC
Start: 2022-03-13 — End: 2022-07-22

## 2022-03-13 MED ORDER — DIVALPROEX SODIUM 500 MG PO DR TAB
DELAYED_RELEASE_TABLET | ORAL | 3 refills | Status: DC
Start: 2022-03-13 — End: 2022-07-22

## 2022-03-13 NOTE — Patient Instructions (Signed)
We will get you started back on Mayzent  For now continue current seizure meds Check labs, MRI No driving  4-6 months with Dr. Terrace Arabia

## 2022-03-14 LAB — CBC WITH DIFFERENTIAL/PLATELET
Basophils Absolute: 0 10*3/uL (ref 0.0–0.2)
Basos: 1 %
EOS (ABSOLUTE): 0.1 10*3/uL (ref 0.0–0.4)
Eos: 2 %
Hematocrit: 41.6 % (ref 37.5–51.0)
Hemoglobin: 13.3 g/dL (ref 13.0–17.7)
Immature Grans (Abs): 0 10*3/uL (ref 0.0–0.1)
Immature Granulocytes: 0 %
Lymphocytes Absolute: 2.2 10*3/uL (ref 0.7–3.1)
Lymphs: 50 %
MCH: 27.1 pg (ref 26.6–33.0)
MCHC: 32 g/dL (ref 31.5–35.7)
MCV: 85 fL (ref 79–97)
Monocytes Absolute: 0.4 10*3/uL (ref 0.1–0.9)
Monocytes: 9 %
Neutrophils Absolute: 1.6 10*3/uL (ref 1.4–7.0)
Neutrophils: 38 %
Platelets: 168 10*3/uL (ref 150–450)
RBC: 4.91 x10E6/uL (ref 4.14–5.80)
RDW: 13.3 % (ref 11.6–15.4)
WBC: 4.4 10*3/uL (ref 3.4–10.8)

## 2022-03-14 LAB — COMPREHENSIVE METABOLIC PANEL
ALT: 16 IU/L (ref 0–44)
AST: 15 IU/L (ref 0–40)
Albumin/Globulin Ratio: 1.8 (ref 1.2–2.2)
Albumin: 4.5 g/dL (ref 4.1–5.1)
Alkaline Phosphatase: 49 IU/L (ref 44–121)
BUN/Creatinine Ratio: 13 (ref 9–20)
BUN: 14 mg/dL (ref 6–20)
Bilirubin Total: 0.3 mg/dL (ref 0.0–1.2)
CO2: 24 mmol/L (ref 20–29)
Calcium: 9.5 mg/dL (ref 8.7–10.2)
Chloride: 100 mmol/L (ref 96–106)
Creatinine, Ser: 1.08 mg/dL (ref 0.76–1.27)
Globulin, Total: 2.5 g/dL (ref 1.5–4.5)
Glucose: 199 mg/dL — ABNORMAL HIGH (ref 70–99)
Potassium: 3.9 mmol/L (ref 3.5–5.2)
Sodium: 140 mmol/L (ref 134–144)
Total Protein: 7 g/dL (ref 6.0–8.5)
eGFR: 91 mL/min/{1.73_m2} (ref >59)

## 2022-03-14 LAB — LEVETIRACETAM LEVEL: Levetiracetam Lvl: 51.5 ug/mL — ABNORMAL HIGH (ref 10.0–40.0)

## 2022-03-14 LAB — VALPROIC ACID LEVEL: Valproic Acid Lvl: 110 ug/mL — ABNORMAL HIGH (ref 50–100)

## 2022-03-18 ENCOUNTER — Telehealth: Payer: Self-pay

## 2022-03-18 NOTE — Telephone Encounter (Signed)
I spoke with the patient to inform of results. I asked the patient if he had fasted before his blood work, he stated he had a glass of orange juice prior to the visit. He will continue his medications as directed. He verbalized understanding and appreciation for the call.

## 2022-03-18 NOTE — Telephone Encounter (Signed)
-----   Message from Glean Salvo, NP sent at 03/18/2022  8:02 AM EDT ----- Please call patient, glucose was elevated 199, not clear that this was fasting, random Keppra level was elevated at 51.5, Depakote level was 110.  No changes to seizure medications.  If seizure frequency increases he will let me know.  Call for any questions or concerns.  Overall, labs showed no significant abnormalities.  Thanks.

## 2022-03-20 ENCOUNTER — Telehealth: Payer: Self-pay | Admitting: Neurology

## 2022-03-20 NOTE — Telephone Encounter (Signed)
[employer] medicaid Berkley Harvey: [id number] exp. 03/19/22-04/18/22 sent to [facility]

## 2022-03-24 ENCOUNTER — Telehealth: Payer: Self-pay

## 2022-03-24 NOTE — Telephone Encounter (Signed)
Received mayzent start form from Maralyn Sago, NP. She is requesting repeat CYP2C9 genotype testing, ECG, and ME screening. Patient is Medicaid and I'm unsure if [employer] will assist with these screenings since patient is not commercially insured. I will reach out to [employer].  CYP2C9 Genotyping was *1/*1- standard dose of 2mg  recommended based on labs on  01/30/2020.

## 2022-03-24 NOTE — Telephone Encounter (Signed)
Pt called back and LVM. Please call back when available.  

## 2022-03-24 NOTE — Telephone Encounter (Signed)
Lois from [employer] will follow this case and let us know if they are able to help with assessments. I spoke with Maralyn Sago, NP. We updated the start from to reflect 2mg  daily RX since he already had CYP2C9 genotyping in 2021. Faxed to 2022. Received a receipt of confirmation.  [employer] asked that the patient call the Mayzent hub at [phone].'  I called patient to ask him to call the hub. No answer, no VM available. Will try again later.

## 2022-03-25 NOTE — Telephone Encounter (Signed)
I called patient. I asked him to call the [facility] hub at [phone]. Patient reports that he will do this today.

## 2022-03-31 NOTE — Telephone Encounter (Signed)
Mayzent PA for starter and maintenance doses completed via CMM and sent to [facility].  Starter key: [id number] Maintenance key: [id number]

## 2022-04-01 NOTE — Telephone Encounter (Signed)
[employer] has denied coverage of patient's Mayzent.  They require that he try and fail dalfampridine and Gilenya.  Patient must consent for the appeal.  I called patient.  I asked him to call [employer] at [phone] and ask for an appeal.  Patient verbalized understanding and will complete this as soon as possible.  Appeal letter completed. Given to Maralyn Sago, NP for review and signature.

## 2022-04-04 ENCOUNTER — Ambulatory Visit (HOSPITAL_COMMUNITY): Admission: RE | Admit: 2022-04-04 | Payer: Medicaid Other | Source: Ambulatory Visit

## 2022-04-07 NOTE — Telephone Encounter (Signed)
Maralyn Sago, NP signed appeal letter. Faxed to [facility]. Patient said he would call last week and consent to the appeal. Received a receipt of confirmation.

## 2022-04-08 NOTE — Telephone Encounter (Signed)
I called patient. He has not called [facility] to give them consent for [place] to appeal on this behalf. He will complete this today and he reports that he still has their phone number. I will check back again with him next week.

## 2022-04-15 NOTE — Telephone Encounter (Signed)
I called [facility]. They cannot see the appeal I sent last week. They will have a nurse reach out to me as soon as possible to verify receipt. Patient has not called in to give consent.  I called patient. He has not called [facility] to give consent. I gave him the phone number of [phone] and told him to give consent for the appeal for mayzent. Patient said that he would take care of this right now. This is my third time asking patient to complete this.

## 2022-04-16 ENCOUNTER — Telehealth: Payer: Self-pay | Admitting: Neurology

## 2022-04-16 NOTE — Telephone Encounter (Signed)
I received the paperwork for Mayzent prescreening,  1.  No apparent macular edema in right or left eye 2.  EKG showed sinus rhythm.  No AV conduction abnormality.

## 2022-04-17 NOTE — Telephone Encounter (Signed)
Sue Lush called wanting to inform RN that the appeal has been received and an answer will be given no later than Sept 27th.

## 2022-04-21 NOTE — Telephone Encounter (Signed)
Noted, thanks!

## 2022-04-23 ENCOUNTER — Telehealth: Payer: Self-pay | Admitting: Neurology

## 2022-04-23 NOTE — Telephone Encounter (Signed)
I received CYP2C9 genotyping *1/*1 standard maintenance dosing is recommended 2 mg once daily.

## 2022-05-06 NOTE — Telephone Encounter (Signed)
I called [employer] to check the status of the mayzent appeal. I spoke with Jocelyn Lamer. This was a 23 min call.  She reports that the appeal was received 05/01/22 and the determination with be made no later than 05/29/22. I explained that this is conflicting information from what we were given on 04/17/22. I asked that this be escalated. I should be receiving a call regarding this. If I am unable to take this call please obtain information from them about when the appeal can be expected to be completed or it was approved or denied and the reference numbers.

## 2022-05-13 NOTE — Telephone Encounter (Signed)
I called [employer]. I spoke with [place]. She reports that the appeal was received on 04/29/22 and a determination will be made no later than 05/29/22. I asked for a call back on 05/06/22 to escalate this and have not gotten a call back.

## 2022-05-21 ENCOUNTER — Encounter: Payer: Self-pay | Admitting: Nurse Practitioner

## 2022-05-21 ENCOUNTER — Ambulatory Visit (INDEPENDENT_AMBULATORY_CARE_PROVIDER_SITE_OTHER): Payer: Medicaid Other | Admitting: Nurse Practitioner

## 2022-05-21 VITALS — BP 134/77 | HR 73 | Ht 67.0 in | Wt 210.2 lb

## 2022-05-21 DIAGNOSIS — E782 Mixed hyperlipidemia: Secondary | ICD-10-CM

## 2022-05-21 DIAGNOSIS — E1165 Type 2 diabetes mellitus with hyperglycemia: Secondary | ICD-10-CM

## 2022-05-21 DIAGNOSIS — E559 Vitamin D deficiency, unspecified: Secondary | ICD-10-CM | POA: Diagnosis not present

## 2022-05-21 DIAGNOSIS — I10 Essential (primary) hypertension: Secondary | ICD-10-CM | POA: Diagnosis not present

## 2022-05-21 LAB — POCT GLYCOSYLATED HEMOGLOBIN (HGB A1C): Hemoglobin A1C: 8.8 % — AB (ref 4.0–5.6)

## 2022-05-21 MED ORDER — GLIPIZIDE ER 5 MG PO TB24: 10.0000 mg | ORAL_TABLET | Freq: Every day | ORAL | 3 refills | Status: DC

## 2022-05-21 MED ORDER — METFORMIN HCL 1000 MG PO TABS: 1000.0000 mg | ORAL_TABLET | Freq: Two times a day (BID) | ORAL | 3 refills | Status: AC

## 2022-05-21 MED ORDER — ATORVASTATIN CALCIUM 20 MG PO TABS: 20.0000 mg | ORAL_TABLET | Freq: Every day | ORAL | 3 refills | Status: AC

## 2022-05-21 NOTE — Progress Notes (Signed)
05/21/2022, 3:19 PM   Endocrinology follow-up note   Subjective:    Patient ID: Martin Stevenson, male    DOB: Feb 14, 1986.  Martin Stevenson is being seen in follow-up  for management of currently uncontrolled symptomatic diabetes, hyperlipidemia, hypertension.  PMD:  Carrolyn Meiers, MD.   Past Medical History:  Diagnosis Date   Diabetes [facility])    Fluid retention    Hypertension    Multiple sclerosis ([place]) 12/29/2017   Seizures ([place])    Past Surgical History:  Procedure Laterality Date   MOUTH SURGERY     x3, jaw, hip, chin   Social History   Socioeconomic History   Marital status: Single    Spouse name: Not on file   Number of children: 3   Years of education: HS   Highest education level: Not on file  Occupational History    Comment: Unemployed  Tobacco Use   Smoking status: Never   Smokeless tobacco: Never  Vaping Use   Vaping Use: Never used  Substance and Sexual Activity   Alcohol use: No    Comment: Quit: 2012   Drug use: No   Sexual activity: Not on file  Other Topics Concern   Not on file  Social History Narrative   Pt lives at home with his spouse.   Caffeine Use: very little   Right handed    Social Determinants of Health   Financial Resource Strain: Not on file  Food Insecurity: Not on file  Transportation Needs: Not on file  Physical Activity: Not on file  Stress: Not on file  Social Connections: Not on file   Outpatient Encounter Medications as of 05/21/2022  Medication Sig   Blood Glucose Monitoring Suppl (ACCU-CHEK GUIDE) w/Device KIT Use to monitor glucose once daily before breakfast   cholecalciferol (VITAMIN D3) 25 MCG (1000 UNIT) tablet Take 1,000 Units by mouth daily.   divalproex (DEPAKOTE) 500 MG DR tablet 2 tablets in the morning, 3 in the evening   gabapentin (NEURONTIN) 600 MG tablet Take 1 tablet (600 mg total) by mouth at bedtime.   glucose  blood (ACCU-CHEK GUIDE) test strip Use as instructed to monitor glucose once daily before breakfast.   levETIRAcetam (KEPPRA) 750 MG tablet Take 2 tablets twice daily   losartan-hydrochlorothiazide (HYZAAR) 100-25 MG tablet Take 1 tablet by mouth daily.   Vitamin D, Ergocalciferol, (DRISDOL) 1.25 MG (50000 UNIT) CAPS capsule Take 1 capsule (50,000 Units total) by mouth once a week.   [DISCONTINUED] atorvastatin (LIPITOR) 20 MG tablet Take 1 tablet (20 mg total) by mouth daily.   [DISCONTINUED] glipiZIDE (GLUCOTROL XL) 5 MG 24 hr tablet Take 2 tablets (10 mg total) by mouth daily with breakfast. TAKE 2 TABLETS BY MOUTH ONCE DAILY WITH BREAKFAST   [DISCONTINUED] metFORMIN (GLUCOPHAGE) 1000 MG tablet Take 1 tablet (1,000 mg total) by mouth 2 (two) times daily with a meal.   atorvastatin (LIPITOR) 20 MG tablet Take 1 tablet (20 mg total) by mouth daily.   glipiZIDE (GLUCOTROL XL) 5 MG 24 hr tablet Take 2 tablets (10 mg total) by mouth daily with breakfast. TAKE 2 TABLETS BY MOUTH ONCE DAILY WITH BREAKFAST  metFORMIN (GLUCOPHAGE) 1000 MG tablet Take 1 tablet (1,000 mg total) by mouth 2 (two) times daily with a meal.   No facility-administered encounter medications on file as of 05/21/2022.    ALLERGIES: Allergies  Allergen Reactions   Benazepril     States that it made his mouth burn   Shellfish Allergy     VACCINATION STATUS:  There is no immunization history on file for this patient.  Diabetes He presents for his follow-up diabetic visit. He has type 2 diabetes mellitus. Onset time: He was diagnosed at approximate age of 51 years. His disease course has been improving. There are no hypoglycemic associated symptoms. Pertinent negatives for hypoglycemia include no confusion, headaches, pallor or seizures. Pertinent negatives for diabetes include no chest pain, no fatigue, no polydipsia, no polyphagia, no polyuria and no weakness. There are no hypoglycemic complications. Symptoms are stable.  There are no diabetic complications. Risk factors for coronary artery disease include diabetes mellitus, male sex, obesity and sedentary lifestyle. Current diabetic treatment includes oral agent (dual therapy). He is compliant with treatment most of the time. His weight is increasing steadily. He is following a generally unhealthy diet. When asked about meal planning, he reported none. He has not had a previous visit with a dietitian. He participates in exercise intermittently. His overall blood glucose range is 140-180 mg/dl. (He presents today with his meter, no logs, showing slightly above target glycemic profile overall.  His POCT A1c today is 8.8%, improving slightly from last visit of 9.5%.  He denies any hypoglycemia.  Says he struggles the most with proper sleep, says he only sleeps for about 3 hours per night.  Analysis of his meter shows 7-day average of 173, 14-day average of 159, 30-day average of 175, 90-day average of 170. ) An ACE inhibitor/angiotensin II receptor blocker is being taken. He does not see a podiatrist.Eye exam is current.  Hyperlipidemia This is a chronic problem. The current episode started more than 1 year ago. The problem is controlled. Recent lipid tests were reviewed and are normal. Exacerbating diseases include diabetes. Factors aggravating his hyperlipidemia include thiazides. Pertinent negatives include no chest pain. Current antihyperlipidemic treatment includes statins. The current treatment provides mild improvement of lipids. There are no compliance problems.  Risk factors for coronary artery disease include diabetes mellitus, dyslipidemia, male sex, obesity, a sedentary lifestyle and family history.  Hypertension This is a chronic problem. The current episode started more than 1 year ago. The problem is unchanged. The problem is controlled. Pertinent negatives include no chest pain or headaches. There are no associated agents to hypertension. Risk factors for coronary  artery disease include diabetes mellitus, dyslipidemia, family history and male gender. Past treatments include angiotensin blockers and diuretics. The current treatment provides mild improvement. There are no compliance problems.      Review of systems  Constitutional: + stable body weight,  current Body mass index is 32.92 kg/m., no fatigue, no subjective hyperthermia, no subjective hypothermia Eyes: no blurry vision, no xerophthalmia ENT: no sore throat, no nodules palpated in throat, no dysphagia/odynophagia, no hoarseness Cardiovascular: no chest pain, no shortness of breath, no palpitations, no leg swelling Respiratory: no cough, no shortness of breath Gastrointestinal: no nausea/vomiting/diarrhea Musculoskeletal: no muscle/joint aches Skin: no rashes, no hyperemia Neurological: no tremors, + numbness/tingling to BLE, no dizziness Psychiatric: no depression, no anxiety   Objective:    BP 134/77 (BP Location: Right Arm, Patient Position: Sitting, Cuff Size: Large)   Pulse 73   Ht  $'5\' 7"'H$  (1.702 m)   Wt 210 lb 3.2 oz (95.3 kg)   BMI 32.92 kg/m   Wt Readings from Last 3 Encounters:  05/21/22 210 lb 3.2 oz (95.3 kg)  03/13/22 214 lb (97.1 kg)  02/18/22 211 lb (95.7 kg)    BP Readings from Last 3 Encounters:  05/21/22 134/77  03/13/22 120/78  02/18/22 130/82     Physical Exam- Limited  Constitutional:  Body mass index is 32.92 kg/m. , not in acute distress, normal state of mind Eyes:  EOMI, no exophthalmos Neck: Supple Cardiovascular: RRR, no murmurs, rubs, or gallops, no edema Respiratory: Adequate breathing efforts, no crackles, rales, rhonchi, or wheezing Musculoskeletal: no gross deformities, strength intact in all four extremities, no gross restriction of joint movements Skin:  no rashes, no hyperemia Neurological: no tremor with outstretched hands  Diabetic Foot Exam - Simple   Simple Foot Form Diabetic Foot exam was performed with the following findings: Yes  05/21/2022  3:18 PM  Visual Inspection No deformities, no ulcerations, no other skin breakdown bilaterally: Yes Sensation Testing Intact to touch and monofilament testing bilaterally: Yes Pulse Check Posterior Tibialis and Dorsalis pulse intact bilaterally: Yes Comments     Recent Results (from the past 2160 hour(s))  CBC with Differential/Platelet     Status: None   Collection Time: 03/13/22  8:26 AM  Result Value Ref Range   WBC 4.4 3.4 - 10.8 x10E3/uL   RBC 4.91 4.14 - 5.80 x10E6/uL   Hemoglobin 13.3 13.0 - 17.7 g/dL   Hematocrit 41.6 37.5 - 51.0 %   MCV 85 79 - 97 fL   MCH 27.1 26.6 - 33.0 pg   MCHC 32.0 31.5 - 35.7 g/dL   RDW 13.3 11.6 - 15.4 %   Platelets 168 150 - 450 x10E3/uL   Neutrophils 38 Not Estab. %   Lymphs 50 Not Estab. %   Monocytes 9 Not Estab. %   Eos 2 Not Estab. %   Basos 1 Not Estab. %   Neutrophils Absolute 1.6 1.4 - 7.0 x10E3/uL   Lymphocytes Absolute 2.2 0.7 - 3.1 x10E3/uL   Monocytes Absolute 0.4 0.1 - 0.9 x10E3/uL   EOS (ABSOLUTE) 0.1 0.0 - 0.4 x10E3/uL   Basophils Absolute 0.0 0.0 - 0.2 x10E3/uL   Immature Granulocytes 0 Not Estab. %   Immature Grans (Abs) 0.0 0.0 - 0.1 x10E3/uL  CMP     Status: Abnormal   Collection Time: 03/13/22  8:26 AM  Result Value Ref Range   Glucose 199 (H) 70 - 99 mg/dL   BUN 14 6 - 20 mg/dL   Creatinine, Ser 1.08 0.76 - 1.27 mg/dL   eGFR 91 >59 mL/min/1.73   BUN/Creatinine Ratio 13 9 - 20   Sodium 140 134 - 144 mmol/L   Potassium 3.9 3.5 - 5.2 mmol/L   Chloride 100 96 - 106 mmol/L   CO2 24 20 - 29 mmol/L   Calcium 9.5 8.7 - 10.2 mg/dL   Total Protein 7.0 6.0 - 8.5 g/dL   Albumin 4.5 4.1 - 5.1 g/dL   Globulin, Total 2.5 1.5 - 4.5 g/dL   Albumin/Globulin Ratio 1.8 1.2 - 2.2   Bilirubin Total 0.3 0.0 - 1.2 mg/dL   Alkaline Phosphatase 49 44 - 121 IU/L   AST 15 0 - 40 IU/L   ALT 16 0 - 44 IU/L  Levetiracetam level     Status: Abnormal   Collection Time: 03/13/22  8:26 AM  Result Value Ref Range  Levetiracetam Lvl 51.5 (H) 10.0 - 40.0 ug/mL  Valproic Acid Level     Status: Abnormal   Collection Time: 03/13/22  8:26 AM  Result Value Ref Range   Valproic Acid Lvl 110 (H) 50 - 100 ug/mL    Comment:                                 Detection Limit = 4                            <4 indicates None Detected Toxicity may occur at levels of 100-500. Measurements of free unbound valproic acid may improve the assess- ment of clinical response. Patient drug level exceeds published reference range.  Evaluate clinically for signs of potential toxicity.   HgB A1c     Status: Abnormal   Collection Time: 05/21/22  3:04 PM  Result Value Ref Range   Hemoglobin A1C 8.8 (A) 4.0 - 5.6 %   HbA1c POC (<> result, manual entry)     HbA1c, POC (prediabetic range)     HbA1c, POC (controlled diabetic range)          Latest Ref Rng & Units 03/13/2022    8:26 AM 11/12/2021   10:37 AM 03/12/2021   11:20 AM  CMP  Glucose 70 - 99 mg/dL 199  133  299   BUN 6 - 20 mg/dL $Remove'14  16  14   'otqvHXq$ Creatinine 0.76 - 1.27 mg/dL 1.08  0.96  0.89   Sodium 134 - 144 mmol/L 140  140  136   Potassium 3.5 - 5.2 mmol/L 3.9  4.3  4.0   Chloride 96 - 106 mmol/L 100  99  98   CO2 20 - 29 mmol/L $RemoveB'24  23  30   'QawLgvtp$ Calcium 8.7 - 10.2 mg/dL 9.5  10.0  9.9   Total Protein 6.0 - 8.5 g/dL 7.0  6.9  7.1   Total Bilirubin 0.0 - 1.2 mg/dL 0.3  0.3  0.5   Alkaline Phos 44 - 121 IU/L 49  43    AST 0 - 40 IU/L $Remov'15  11  13   'YIqMrW$ ALT 0 - 44 IU/L $Remov'16  9  16    'HNAzxU$ Lipid Panel     Component Value Date/Time   CHOL 161 11/12/2021 1038   TRIG 88 11/12/2021 1038   HDL 47 11/12/2021 1038   CHOLHDL 3.4 11/12/2021 1038   CHOLHDL 5.1 (H) 03/12/2021 1120   LDLCALC 98 11/12/2021 1038   LDLCALC 137 (H) 03/12/2021 1120   LABVLDL 16 11/12/2021 1038    Assessment & Plan:   1) Uncontrolled type 2 diabetes mellitus with hyperglycemia ([place])  - Martin Stevenson has currently uncontrolled symptomatic type 2 DM since 36 years of age.  He presents today with his meter,  no logs, showing slightly above target glycemic profile overall.  His POCT A1c today is 8.8%, improving slightly from last visit of 9.5%.  He denies any hypoglycemia.  Says he struggles the most with proper sleep, says he only sleeps for about 3 hours per night.  Analysis of his meter shows 7-day average of 173, 14-day average of 159, 30-day average of 175, 90-day average of 170.  -He does not report any gross complications from his diabetes, however patient with multiple sclerosis as well as seizure disorders,  and he remains at a high risk for more acute  and chronic complications which include CAD, CVA, CKD, retinopathy, and neuropathy. These are all discussed in detail with him.  - Nutritional counseling repeated at each appointment due to patients tendency to fall back in to old habits.  - The patient admits there is a room for improvement in their diet and drink choices. -  Suggestion is made for the patient to avoid simple carbohydrates from their diet including Cakes, Sweet Desserts / Pastries, Ice Cream, Soda (diet and regular), Sweet Tea, Candies, Chips, Cookies, Sweet Pastries, Store Bought Juices, Alcohol in Excess of 1-2 drinks a day, Artificial Sweeteners, Coffee Creamer, and "Sugar-free" Products. This will help patient to have stable blood glucose profile and potentially avoid unintended weight gain.   - I encouraged the patient to switch to unprocessed or minimally processed complex starch and increased protein intake (animal or plant source), fruits, and vegetables.   - Patient is advised to stick to a routine mealtimes to eat 3 meals a day and avoid unnecessary snacks (to snack only to correct hypoglycemia).  -Given his improving glycemic profile, no changes will be made to his medications today.  He promises to continue to work hard to bring it down without adding more meds.  He is advised to continue Metformin 1000 mg po twice daily with meals and Glipizide 10 mg XL daily with  breakfast.   I discussed the potential of adding GLP1 to assist in weight loss efforts and provide cardiovascular protection but he politely declined, wants to wait a bit longer to see if he can do it with lifestyle modifications.  -He is willing to continue monitoring blood glucose at least once a day-daily before breakfast and at any other time as needed and to call the clinic if he has readings less than 70 or greater than 200 for 3 tests in a row.    - Patient specific target  A1c;  LDL, HDL, Triglycerides,  were discussed in detail.  2) BP/HTN:  His blood pressure is controlled to target.  He is allergic to ACE inhibitors.  He is advised to restart Losartan- HCT 100-25 mg po daily.   3) Lipids/HPL:  His most recent lipid panel from 11/12/21 shows controlled LDL of 98.   He is advised to continue his Lipitor 20 mg po daily at bedtime.  Side effects and precautions discussed with him.  4)  Weight/Diet:  His Body mass index is 32.92 kg/m.  I discussed with him the fact that loss of 5 - 10% of his  current body weight will have the most impact on his diabetes management.  CDE Consult will be initiated . Exercise, and detailed carbohydrates information provided  -  detailed on discharge instructions.  5) Vitamin D deficiency:  His most recent vitamin D level was 18.3 on 11/12/21.  He is taking OTC Vitamin D3 5000 units daily. He has been on Ergocalciferol 50000 units PO weekly in the past but will reinitiate this as he could use boost in his levels.  6) Chronic Care/Health Maintenance: -he is on ARB and statin medications and is encouraged to initiate and continue to follow up with Ophthalmology, Dentist,  Podiatrist at least yearly or according to recommendations, and advised to stay away from smoking. I have recommended yearly flu vaccine and pneumonia vaccine at least every 5 years; moderate intensity exercise for up to 150 minutes weekly; and  sleep for at least 7 hours a day.  - I advised  patient to maintain close follow up with  Carrolyn Meiers, MD for primary care needs.     I spent 30 minutes in the care of the patient today including review of labs from [place], Lipids, Thyroid Function, Hematology (current and previous including abstractions from other facilities); face-to-face time discussing  his blood glucose readings/logs, discussing hypoglycemia and hyperglycemia episodes and symptoms, medications doses, his options of short and long term treatment based on the latest standards of care / guidelines;  discussion about incorporating lifestyle medicine;  and documenting the encounter. Risk reduction counseling performed per USPSTF guidelines to reduce obesity and cardiovascular risk factors.     Please refer to Patient Instructions for Blood Glucose Monitoring and Insulin/Medications Dosing Guide"  in media tab for additional information. Please  also refer to " Patient Self Inventory" in the Media  tab for reviewed elements of pertinent patient history.  Martin Stevenson participated in the discussions, expressed understanding, and voiced agreement with the above plans.  All questions were answered to his satisfaction. he is encouraged to contact clinic should he have any questions or concerns prior to his return visit.   Follow up plan: - Return in about 3 months (around 08/21/2022) for Diabetes F/U with A1c in office, No previsit labs, Bring meter and logs.  Rayetta Pigg, [facility] [address] [place], [place] [address] Phone: [phone] Fax: [phone]  05/21/2022, 3:19 PM

## 2022-05-22 NOTE — Telephone Encounter (Signed)
Mayzent approval received via fax until 08/10/2022. Letter placed on Kristens desk for review

## 2022-05-26 NOTE — Telephone Encounter (Signed)
I called patient.  He reports that he already has the [facility] phone number [phone]) and will call them to schedule the [facility] delivery. He will let me know if you have questions or concerns.  I will check on him in a few weeks to make sure he received his mayzent.

## 2022-05-27 NOTE — Telephone Encounter (Signed)
I called [facility].  I spoke with Cecille Rubin.  She verified patient's prescription but nothing is needed from our office at this time.  They will reach out to patient again today.  The prescription is ready to be scheduled for delivery.

## 2022-05-27 NOTE — Telephone Encounter (Signed)
Pt is calling. Said he needs for a nurse to call [facility] so they will release his mayzent prescription. (P) [phone]

## 2022-06-02 NOTE — Telephone Encounter (Signed)
I called patient. He has not called [facility] to set up delivery of his mayzent. I asked him to call [facility] and provided him with their phone number. Pt verbalized understanding.

## 2022-06-09 NOTE — Telephone Encounter (Signed)
I called patient. He reports that he called [facility] and was told they would call them back. He has not received a call yet.  I called [facility]. Spoke with Elmyra Ricks, pharmD. The prescription is ready to be scheduled for delivery but they have been unable to reach the patient because his phone number has calling restrictions.   I called patient. I asked him to call [phone] again to schedule mayzent delivery. Pt verbalized understanding.

## 2022-06-16 NOTE — Telephone Encounter (Signed)
I called patient.  He reports that he has gotten his Mayzent and started taking it.  He is finishing up with the starter pack.  He is tolerating it well.  I reminded him of his appointment on December 12 with Dr. Krista Blue.  Patient verbalized understanding.

## 2022-07-22 ENCOUNTER — Ambulatory Visit (INDEPENDENT_AMBULATORY_CARE_PROVIDER_SITE_OTHER): Payer: Medicaid Other | Admitting: Neurology

## 2022-07-22 ENCOUNTER — Encounter: Payer: Self-pay | Admitting: Neurology

## 2022-07-22 VITALS — BP 114/78 | HR 73 | Ht 67.0 in | Wt 208.5 lb

## 2022-07-22 DIAGNOSIS — Z5181 Encounter for therapeutic drug level monitoring: Secondary | ICD-10-CM | POA: Diagnosis not present

## 2022-07-22 DIAGNOSIS — R569 Unspecified convulsions: Secondary | ICD-10-CM | POA: Diagnosis not present

## 2022-07-22 DIAGNOSIS — G35 Multiple sclerosis: Secondary | ICD-10-CM | POA: Diagnosis not present

## 2022-07-22 MED ORDER — GABAPENTIN 600 MG PO TABS: 600.0000 mg | ORAL_TABLET | Freq: Every day | ORAL | 3 refills | Status: DC

## 2022-07-22 MED ORDER — MAYZENT 2 MG PO TABS: 2.0000 mg | ORAL_TABLET | Freq: Every day | ORAL | 3 refills | Status: DC

## 2022-07-22 MED ORDER — LEVETIRACETAM 750 MG PO TABS: ORAL_TABLET | ORAL | 3 refills | Status: DC

## 2022-07-22 MED ORDER — DIVALPROEX SODIUM 500 MG PO DR TAB: DELAYED_RELEASE_TABLET | ORAL | 3 refills | Status: DC

## 2022-07-22 NOTE — Progress Notes (Signed)
Chief Complaint  Patient presents with   Follow-up    Rm 14. Alone. No new concerns. Requesting a refill on Mayzent, is out of medication.      ASSESSMENT AND PLAN  Martin Stevenson is a 36 y.o. male   Relapsing remitting multiple sclerosis,  Abnormal MRI of brain, no evidence of spinal cord involvement, multiple repeat MRI showed stabilization, most recent was in August 2022  Spinal fluid testing in March 2019 showed more than 5 oligoclonal banding  Doing well on Mayzent, refilled his prescription  Complex partial seizure with secondary generalization  Doing well taking current Keppra 750 mg 2 tablets twice a day, Depakote 500 mg 2/3  Last recurrent minor partial seizure was in November 2023, had recurrent small partial seizure about twice each year, decided to stay on current medication,   Return To Clinic With NP In 6 Months   DIAGNOSTIC DATA (LABS, IMAGING, TESTING) - I reviewed patient records, labs, notes, testing and imaging myself where available.   MEDICAL HISTORY:  Martin MooreIs a 36 year old male, following up for Relapsing Remitting Multiple Sclerosis, his primary care physician is Dr. Legrand Rams, [place] he was patient of Dr. Jannifer [place] in the past  I reviewed and summarized the referring note. PMHX. 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 Jannifer [place] since February 2019 for recurrent seizure, 2 maternal cousins also have seizure, seizure often preceded by dj vu sensation, then blackout, jerking all fours, with occasionally tongue biting bowel bladder incontinence  He could not tolerate Dilantin in the past," felt like a zombie", he was started on titrating dose of Keppra, Depakote, currently taking Keppra 750 mg 2 tablets twice a day, Depakote DR 500 mg 2 tablets in the morning, 3 at evening, with current dose, his seizure is overall under good control, only couple  times a year he will have a smaller spells, most recent one was in November 2023, he had dj vu sensation, lightheaded, mild confusion, lasting for few minutes, did not spread to generalized convulsion  EEG in May 2019 by Dr. Jannifer [place] reported sharp with spike-wave activity at the right frontotemporal region   For workup of his seizure, he had MRI of the brain with without contrast in March 2019, noted periventricular subcortical T2/FLAIR hyperintensity lesions, with the possibility of embolus  Lumbar puncture on November 03, 2017 showed more than 5 oligoclonal banding  MRI of cervical spine in March 2019 showed no cord involvement  He was treated with Tecfidera in early 2020, JC virus titer was elevated 3.58 in May 2019, will switch to [place] since August 2021 due to repeat MRI of the brain in March 2021 showed new left brainstem lesion  Patient denies any noticeable flareup, denies gait abnormality, on disability, has 4 children, spent most of the time playing with his children, no driving, denies visual loss,  Personally reviewed most recent MRI of the brain with without contrast March 22, 2021, MS lesions involving left inferior cerebellar peduncle, periventricular and deep white matter, no contrast-enhancement, no change compared to March 2021   PHYSICAL EXAM:   Vitals:   07/22/22 0913  BP: 114/78  Pulse: 73  Weight: 208 lb 8 oz (94.6 kg)  Height: _0  (1.702 m)     Body mass index is 32.66 kg/m.  PHYSICAL EXAMNIATION:  Gen: NAD, conversant, well nourised, well groomed  Cardiovascular: Regular rate rhythm, no peripheral edema, warm, nontender. Eyes: Conjunctivae clear without exudates or hemorrhage Neck: Supple, no carotid bruits. Pulmonary: Clear to auscultation bilaterally   NEUROLOGICAL EXAM:  MENTAL STATUS: Speech/cognition: Awake, alert, oriented to history taking and casual conversation CRANIAL NERVES: CN II: Visual fields are full to  confrontation. Pupils are round equal and briskly reactive to light. CN III, IV, VI: extraocular movement are normal. No ptosis. CN V: Facial sensation is intact to light touch CN VII: Face is symmetric with normal eye closure  CN VIII: Hearing is normal to causal conversation. CN IX, X: Phonation is normal. CN XI: Head turning and shoulder shrug are intact  MOTOR: There is no pronator drift of out-stretched arms. Muscle bulk and tone are normal. Muscle strength is normal.  REFLEXES: Reflexes are 2+ and symmetric at the biceps, triceps, knees, and ankles. Plantar responses are flexor.  SENSORY: Intact to light touch, pinprick and vibratory sensation are intact in fingers and toes.  COORDINATION: There is no trunk or limb dysmetria noted.  GAIT/STANCE: Posture is normal. Gait is steady with normal steps, base, arm swing, and turning. Heel and toe walking are normal. Tandem gait is normal.  Romberg is absent.  REVIEW OF SYSTEMS:  Full 14 system review of systems performed and notable only for as above All other review of systems were negative.   ALLERGIES: Allergies  Allergen Reactions   Benazepril     States that it made his mouth burn   Shellfish Allergy     HOME MEDICATIONS: Current Outpatient Medications  Medication Sig Dispense Refill   atorvastatin (LIPITOR) 20 MG tablet Take 1 tablet (20 mg total) by mouth daily. 90 tablet 3   Blood Glucose Monitoring Suppl (ACCU-CHEK GUIDE) w/Device KIT Use to monitor glucose once daily before breakfast 1 kit 0   cholecalciferol (VITAMIN D3) 25 MCG (1000 UNIT) tablet Take 1,000 Units by mouth daily.     divalproex (DEPAKOTE) 500 MG DR tablet 2 tablets in the morning, 3 in the evening 450 tablet 3   gabapentin (NEURONTIN) 600 MG tablet Take 1 tablet (600 mg total) by mouth at bedtime. 90 tablet 1   glipiZIDE (GLUCOTROL XL) 5 MG 24 hr tablet Take 2 tablets (10 mg total) by mouth daily with breakfast. TAKE 2 TABLETS BY MOUTH ONCE DAILY  WITH BREAKFAST 180 tablet 3   glucose blood (ACCU-CHEK GUIDE) test strip Use as instructed to monitor glucose once daily before breakfast. 100 each 12   levETIRAcetam (KEPPRA) 750 MG tablet Take 2 tablets twice daily 360 tablet 3   losartan-hydrochlorothiazide (HYZAAR) 100-25 MG tablet Take 1 tablet by mouth daily. 90 tablet 3   metFORMIN (GLUCOPHAGE) 1000 MG tablet Take 1 tablet (1,000 mg total) by mouth 2 (two) times daily with a meal. 180 tablet 3   Siponimod Fumarate (MAYZENT) 2 MG TABS Take 2 mg by mouth daily.     Vitamin D, Ergocalciferol, (DRISDOL) 1.25 MG (50000 UNIT) CAPS capsule Take 1 capsule (50,000 Units total) by mouth once a week. 12 capsule 0   No current facility-administered medications for this visit.    PAST MEDICAL HISTORY: Past Medical History:  Diagnosis Date   Diabetes ([place])    Fluid retention    Hypertension    Multiple sclerosis ([place]) 12/29/2017   Seizures ([place])     PAST SURGICAL HISTORY: Past Surgical History:  Procedure Laterality Date   MOUTH SURGERY     x3, jaw, hip, chin    FAMILY HISTORY:  Family History  Problem Relation Age of Onset   Diabetes Mother    Pulmonary embolism Mother        hx of blood clots   Diabetes Father     SOCIAL HISTORY: Social History   Socioeconomic History   Marital status: Single    Spouse name: Not on file   Number of children: 3   Years of education: HS   Highest education level: Not on file  Occupational History    Comment: Unemployed  Tobacco Use   Smoking status: Never   Smokeless tobacco: Never  Vaping Use   Vaping Use: Never used  Substance and Sexual Activity   Alcohol use: No    Comment: Quit: 2012   Drug use: No   Sexual activity: Not on file  Other Topics Concern   Not on file  Social History Narrative   Pt lives at home with his spouse.   Caffeine Use: very little   Right handed    Social Determinants of Health   Financial Resource Strain: Not on file  Food Insecurity: Not on file   Transportation Needs: Not on file  Physical Activity: Not on file  Stress: Not on file  Social Connections: Not on file  Intimate Partner Violence: Not on file     Chief Complaint  Patient presents with   Follow-up    Rm 14. Alone. No new concerns. Requesting a refill on Mayzent, is out of medication.      ASSESSMENT AND PLAN  Martin Stevenson is a 36 y.o. male     DIAGNOSTIC DATA (LABS, IMAGING, TESTING) - I reviewed patient records, labs, notes, testing and imaging myself where available.   MEDICAL HISTORY:  Martin Stevenson, seen in request by   Carrolyn Meiers, MD [place],  [place] [address], Carrolyn Meiers, MD   I reviewed and summarized the referring note. Past Medical History:  Diagnosis Date   Diabetes ([place])    Fluid retention    Hypertension    Multiple sclerosis ([place]) 12/29/2017   Seizures ([place])    Past Surgical History:  Procedure Laterality Date   MOUTH SURGERY     x3, jaw, hip, chin     PHYSICAL EXAM:   Vitals:   07/22/22 0913  BP: 114/78  Pulse: 73  Weight: 208 lb 8 oz (94.6 kg)  Height: _0  (1.702 m)   Not recorded     Body mass index is 32.66 kg/m.  PHYSICAL EXAMNIATION:  Gen: NAD, conversant, well nourised, well groomed                     Cardiovascular: Regular rate rhythm, no peripheral edema, warm, nontender. Eyes: Conjunctivae clear without exudates or hemorrhage Neck: Supple, no carotid bruits. Pulmonary: Clear to auscultation bilaterally   NEUROLOGICAL EXAM:  MENTAL STATUS: Speech/cognition: Awake, alert, oriented to history taking and casual conversation CRANIAL NERVES: CN II: Visual fields are full to confrontation. Pupils are round equal and briskly reactive to light. CN III, IV, VI: extraocular movement are normal. No ptosis. CN V: Facial sensation is intact to light touch CN VII: Face is symmetric with normal eye closure  CN VIII: Hearing is normal to causal  conversation. CN IX, X: Phonation is normal. CN XI: Head turning and shoulder shrug are intact  MOTOR: There is no pronator drift of out-stretched arms. Muscle bulk and tone are normal. Muscle strength is normal.  REFLEXES: Reflexes are 2+ and symmetric at  the biceps, triceps, knees, and ankles. Plantar responses are flexor.  SENSORY: Intact to light touch, pinprick and vibratory sensation are intact in fingers and toes.  COORDINATION: There is no trunk or limb dysmetria noted.  GAIT/STANCE: Posture is normal. Gait is steady with normal steps, base, arm swing, and turning. Heel and toe walking are normal. Tandem gait is normal.  Romberg is absent.  REVIEW OF SYSTEMS:  Full 14 system review of systems performed and notable only for as above All other review of systems were negative.   ALLERGIES: Allergies  Allergen Reactions   Benazepril     States that it made his mouth burn   Shellfish Allergy     HOME MEDICATIONS: Current Outpatient Medications  Medication Sig Dispense Refill   atorvastatin (LIPITOR) 20 MG tablet Take 1 tablet (20 mg total) by mouth daily. 90 tablet 3   Blood Glucose Monitoring Suppl (ACCU-CHEK GUIDE) w/Device KIT Use to monitor glucose once daily before breakfast 1 kit 0   cholecalciferol (VITAMIN D3) 25 MCG (1000 UNIT) tablet Take 1,000 Units by mouth daily.     glipiZIDE (GLUCOTROL XL) 5 MG 24 hr tablet Take 2 tablets (10 mg total) by mouth daily with breakfast. TAKE 2 TABLETS BY MOUTH ONCE DAILY WITH BREAKFAST 180 tablet 3   glucose blood (ACCU-CHEK GUIDE) test strip Use as instructed to monitor glucose once daily before breakfast. 100 each 12   losartan-hydrochlorothiazide (HYZAAR) 100-25 MG tablet Take 1 tablet by mouth daily. 90 tablet 3   metFORMIN (GLUCOPHAGE) 1000 MG tablet Take 1 tablet (1,000 mg total) by mouth 2 (two) times daily with a meal. 180 tablet 3   Vitamin D, Ergocalciferol, (DRISDOL) 1.25 MG (50000 UNIT) CAPS capsule Take 1 capsule  (50,000 Units total) by mouth once a week. 12 capsule 0   divalproex (DEPAKOTE) 500 MG DR tablet 2 tablets in the morning, 3 in the evening 450 tablet 3   gabapentin (NEURONTIN) 600 MG tablet Take 1 tablet (600 mg total) by mouth at bedtime. 90 tablet 3   levETIRAcetam (KEPPRA) 750 MG tablet Take 2 tablets twice daily 360 tablet 3   Siponimod Fumarate (MAYZENT) 2 MG TABS Take 2 mg by mouth daily. 90 tablet 3   No current facility-administered medications for this visit.    PAST MEDICAL HISTORY: Past Medical History:  Diagnosis Date   Diabetes ([place])    Fluid retention    Hypertension    Multiple sclerosis ([place]) 12/29/2017   Seizures ([place])     PAST SURGICAL HISTORY: Past Surgical History:  Procedure Laterality Date   MOUTH SURGERY     x3, jaw, hip, chin    FAMILY HISTORY: Family History  Problem Relation Age of Onset   Diabetes Mother    Pulmonary embolism Mother        hx of blood clots   Diabetes Father     SOCIAL HISTORY: Social History   Socioeconomic History   Marital status: Single    Spouse name: Not on file   Number of children: 3   Years of education: HS   Highest education level: Not on file  Occupational History    Comment: Unemployed  Tobacco Use   Smoking status: Never   Smokeless tobacco: Never  Vaping Use   Vaping Use: Never used  Substance and Sexual Activity   Alcohol use: No    Comment: Quit: 2012   Drug use: No   Sexual activity: Not on file  Other Topics Concern  Not on file  Social History Narrative   Pt lives at home with his spouse.   Caffeine Use: very little   Right handed    Social Determinants of Health   Financial Resource Strain: Not on file  Food Insecurity: Not on file  Transportation Needs: Not on file  Physical Activity: Not on file  Stress: Not on file  Social Connections: Not on file  Intimate Partner Violence: Not on file      Marcial Pacas, M.D. Ph.D.  [facility] [address] [place], [place] [address] Ph: [phone] Fax: [phone]  CC:  Carrolyn Meiers, MD [place],  [place] [id number]  Carrolyn Meiers, MD     Marcial Pacas, M.D. Ph.D.  [facility] [address], [place], [place] [address] Ph: [phone] Fax: [phone]  CC:  Carrolyn Meiers, MD [place],  [place] [id number]  Carrolyn Meiers, MD

## 2022-08-21 ENCOUNTER — Ambulatory Visit (INDEPENDENT_AMBULATORY_CARE_PROVIDER_SITE_OTHER): Payer: Medicaid Other | Admitting: Nurse Practitioner

## 2022-08-21 ENCOUNTER — Encounter: Payer: Self-pay | Admitting: Nurse Practitioner

## 2022-08-21 VITALS — BP 119/79 | HR 80 | Ht 67.0 in | Wt 206.6 lb

## 2022-08-21 DIAGNOSIS — E559 Vitamin D deficiency, unspecified: Secondary | ICD-10-CM | POA: Diagnosis not present

## 2022-08-21 DIAGNOSIS — E782 Mixed hyperlipidemia: Secondary | ICD-10-CM

## 2022-08-21 DIAGNOSIS — I10 Essential (primary) hypertension: Secondary | ICD-10-CM

## 2022-08-21 DIAGNOSIS — E1165 Type 2 diabetes mellitus with hyperglycemia: Secondary | ICD-10-CM | POA: Diagnosis not present

## 2022-08-21 LAB — POCT GLYCOSYLATED HEMOGLOBIN (HGB A1C): Hemoglobin A1C: 9.6 % — AB (ref 4.0–5.6)

## 2022-08-21 MED ORDER — VITAMIN D (ERGOCALCIFEROL) 1.25 MG (50000 UNIT) PO CAPS: 50000.0000 [IU] | ORAL_CAPSULE | ORAL | 0 refills | Status: DC

## 2022-08-21 NOTE — Progress Notes (Signed)
08/21/2022, 3:55 PM   Endocrinology follow-up note   Subjective:    Patient ID: Martin Stevenson, male    DOB: 01/21/1986.  Martin Stevenson is being seen in follow-up  for management of currently uncontrolled symptomatic diabetes, hyperlipidemia, hypertension.  PMD:  Benetta Spar, MD.   Past Medical History:  Diagnosis Date   Diabetes [facility])    Fluid retention    Hypertension    Multiple sclerosis (HCC) 12/29/2017   Seizures (HCC)    Past Surgical History:  Procedure Laterality Date   MOUTH SURGERY     x3, jaw, hip, chin   Social History   Socioeconomic History   Marital status: Single    Spouse name: Not on file   Number of children: 3   Years of education: HS   Highest education level: Not on file  Occupational History    Comment: Unemployed  Tobacco Use   Smoking status: Never   Smokeless tobacco: Never  Vaping Use   Vaping Use: Never used  Substance and Sexual Activity   Alcohol use: No    Comment: Quit: 2012   Drug use: No   Sexual activity: Not on file  Other Topics Concern   Not on file  Social History Narrative   Pt lives at home with his spouse.   Caffeine Use: very little   Right handed    Social Determinants of Health   Financial Resource Strain: Not on file  Food Insecurity: Not on file  Transportation Needs: Not on file  Physical Activity: Not on file  Stress: Not on file  Social Connections: Not on file   Outpatient Encounter Medications as of 08/21/2022  Medication Sig   atorvastatin (LIPITOR) 20 MG tablet Take 1 tablet (20 mg total) by mouth daily.   Blood Glucose Monitoring Suppl (ACCU-CHEK GUIDE) w/Device KIT Use to monitor glucose once daily before breakfast   cholecalciferol (VITAMIN D3) 25 MCG (1000 UNIT) tablet Take 1,000 Units by mouth daily.   divalproex (DEPAKOTE) 500 MG DR tablet 2 tablets in the morning, 3 in the evening   gabapentin  (NEURONTIN) 600 MG tablet Take 1 tablet (600 mg total) by mouth at bedtime.   glipiZIDE (GLUCOTROL XL) 5 MG 24 hr tablet Take 2 tablets (10 mg total) by mouth daily with breakfast. TAKE 2 TABLETS BY MOUTH ONCE DAILY WITH BREAKFAST   glucose blood (ACCU-CHEK GUIDE) test strip Use as instructed to monitor glucose once daily before breakfast.   levETIRAcetam (KEPPRA) 750 MG tablet Take 2 tablets twice daily   losartan-hydrochlorothiazide (HYZAAR) 100-25 MG tablet Take 1 tablet by mouth daily.   metFORMIN (GLUCOPHAGE) 1000 MG tablet Take 1 tablet (1,000 mg total) by mouth 2 (two) times daily with a meal.   Siponimod Fumarate (MAYZENT) 2 MG TABS Take 2 mg by mouth daily.   [DISCONTINUED] Vitamin D, Ergocalciferol, (DRISDOL) 1.25 MG (50000 UNIT) CAPS capsule Take 1 capsule (50,000 Units total) by mouth once a week.   Vitamin D, Ergocalciferol, (DRISDOL) 1.25 MG (50000 UNIT) CAPS capsule Take 1 capsule (50,000 Units total) by mouth once a week.   No facility-administered encounter medications on file as of 08/21/2022.  ALLERGIES: Allergies  Allergen Reactions   Benazepril     States that it made his mouth burn   Shellfish Allergy     VACCINATION STATUS:  There is no immunization history on file for this patient.  Diabetes He presents for his follow-up diabetic visit. He has type 2 diabetes mellitus. Onset time: He was diagnosed at approximate age of 40 years. His disease course has been worsening. There are no hypoglycemic associated symptoms. Pertinent negatives for hypoglycemia include no confusion, headaches, pallor or seizures. Pertinent negatives for diabetes include no chest pain, no fatigue, no polydipsia, no polyphagia, no polyuria and no weakness. There are no hypoglycemic complications. Symptoms are stable. There are no diabetic complications. Risk factors for coronary artery disease include diabetes mellitus, male sex, obesity and sedentary lifestyle. Current diabetic treatment  includes oral agent (dual therapy). He is compliant with treatment most of the time. His weight is fluctuating minimally. He is following a generally unhealthy diet. When asked about meal planning, he reported none. He has not had a previous visit with a dietitian. He participates in exercise intermittently. His home blood glucose trend is increasing steadily. His overall blood glucose range is 180-200 mg/dl. (He presents today with his meter, no logs, showing above target glycemic profile.  His POCT A1c today is 9.6%, increasing from last visit of 8.8%.  He notes he did over indulge in foods during the holidays.  He did think more about the offer of GLP1 since last visit and still politely declines.  Analysis of his meter shows 7-day average of 197, 14-day average of 178, 30-day average of 188, 90-day average of 197.  He denies any significant hypoglycemia, says he did have some mild fasting hypoglycemia as a result of sleeping in. ) An ACE inhibitor/angiotensin II receptor blocker is being taken. He does not see a podiatrist.Eye exam is current.  Hyperlipidemia This is a chronic problem. The current episode started more than 1 year ago. The problem is controlled. Recent lipid tests were reviewed and are normal. Exacerbating diseases include diabetes. Factors aggravating his hyperlipidemia include thiazides. Pertinent negatives include no chest pain. Current antihyperlipidemic treatment includes statins. The current treatment provides mild improvement of lipids. There are no compliance problems.  Risk factors for coronary artery disease include diabetes mellitus, dyslipidemia, male sex, obesity, a sedentary lifestyle and family history.  Hypertension This is a chronic problem. The current episode started more than 1 year ago. The problem is unchanged. The problem is controlled. Pertinent negatives include no chest pain or headaches. There are no associated agents to hypertension. Risk factors for coronary  artery disease include diabetes mellitus, dyslipidemia, family history and male gender. Past treatments include angiotensin blockers and diuretics. The current treatment provides mild improvement. There are no compliance problems.      Review of systems  Constitutional: + stable body weight,  current Body mass index is 32.36 kg/m., no fatigue, no subjective hyperthermia, no subjective hypothermia Eyes: no blurry vision, no xerophthalmia ENT: no sore throat, no nodules palpated in throat, no dysphagia/odynophagia, no hoarseness Cardiovascular: no chest pain, no shortness of breath, no palpitations, no leg swelling Respiratory: no cough, no shortness of breath Gastrointestinal: no nausea/vomiting/diarrhea Musculoskeletal: no muscle/joint aches Skin: no rashes, no hyperemia Neurological: no tremors, + numbness/tingling to BLE, no dizziness Psychiatric: no depression, no anxiety   Objective:    BP 119/79 (BP Location: Right Arm, Patient Position: Sitting, Cuff Size: Large)   Pulse 80   Ht 5\' 7"  (1.702 m)  Wt 206 lb 9.6 oz (93.7 kg)   BMI 32.36 kg/m   Wt Readings from Last 3 Encounters:  08/21/22 206 lb 9.6 oz (93.7 kg)  07/22/22 208 lb 8 oz (94.6 kg)  05/21/22 210 lb 3.2 oz (95.3 kg)    BP Readings from Last 3 Encounters:  08/21/22 119/79  07/22/22 114/78  05/21/22 134/77     Physical Exam- Limited  Constitutional:  Body mass index is 32.36 kg/m. , not in acute distress, normal state of mind Eyes:  EOMI, no exophthalmos Musculoskeletal: no gross deformities, strength intact in all four extremities, no gross restriction of joint movements Skin:  no rashes, no hyperemia Neurological: no tremor with outstretched hands  Diabetic Foot Exam - Simple   No data filed     Recent Results (from the past 2160 hour(s))  HgB A1c     Status: Abnormal   Collection Time: 08/21/22  2:58 PM  Result Value Ref Range   Hemoglobin A1C 9.6 (A) 4.0 - 5.6 %   HbA1c POC (<> result, manual  entry)     HbA1c, POC (prediabetic range)     HbA1c, POC (controlled diabetic range)          Latest Ref Rng & Units 03/13/2022    8:26 AM 11/12/2021   10:37 AM 03/12/2021   11:20 AM  CMP  Glucose 70 - 99 mg/dL 199  133  299   BUN 6 - 20 mg/dL 14  16  14    Creatinine 0.76 - 1.27 mg/dL 1.08  0.96  0.89   Sodium 134 - 144 mmol/L 140  140  136   Potassium 3.5 - 5.2 mmol/L 3.9  4.3  4.0   Chloride 96 - 106 mmol/L 100  99  98   CO2 20 - 29 mmol/L 24  23  30    Calcium 8.7 - 10.2 mg/dL 9.5  10.0  9.9   Total Protein 6.0 - 8.5 g/dL 7.0  6.9  7.1   Total Bilirubin 0.0 - 1.2 mg/dL 0.3  0.3  0.5   Alkaline Phos 44 - 121 IU/L 49  43    AST 0 - 40 IU/L 15  11  13    ALT 0 - 44 IU/L 16  9  16     Lipid Panel     Component Value Date/Time   CHOL 161 11/12/2021 1038   TRIG 88 11/12/2021 1038   HDL 47 11/12/2021 1038   CHOLHDL 3.4 11/12/2021 1038   CHOLHDL 5.1 (H) 03/12/2021 1120   LDLCALC 98 11/12/2021 1038   LDLCALC 137 (H) 03/12/2021 1120   LABVLDL 16 11/12/2021 1038    Assessment & Plan:   1) Uncontrolled type 2 diabetes mellitus with hyperglycemia ([place])  - Martin Stevenson has currently uncontrolled symptomatic type 2 DM since 37 years of age.  He presents today with his meter, no logs, showing above target glycemic profile.  His POCT A1c today is 9.6%, increasing from last visit of 8.8%.  He notes he did over indulge in foods during the holidays.  He did think more about the offer of GLP1 since last visit and still politely declines.  Analysis of his meter shows 7-day average of 197, 14-day average of 178, 30-day average of 188, 90-day average of 197.  He denies any significant hypoglycemia, says he did have some mild fasting hypoglycemia as a result of sleeping in.  -He does not report any gross complications from his diabetes, however patient with multiple sclerosis as well  as seizure disorders,  and he remains at a high risk for more acute and chronic complications which include CAD,  CVA, CKD, retinopathy, and neuropathy. These are all discussed in detail with him.  - Nutritional counseling repeated at each appointment due to patients tendency to fall back in to old habits.  - The patient admits there is a room for improvement in their diet and drink choices. -  Suggestion is made for the patient to avoid simple carbohydrates from their diet including Cakes, Sweet Desserts / Pastries, Ice Cream, Soda (diet and regular), Sweet Tea, Candies, Chips, Cookies, Sweet Pastries, Store Bought Juices, Alcohol in Excess of 1-2 drinks a day, Artificial Sweeteners, Coffee Creamer, and "Sugar-free" Products. This will help patient to have stable blood glucose profile and potentially avoid unintended weight gain.   - I encouraged the patient to switch to unprocessed or minimally processed complex starch and increased protein intake (animal or plant source), fruits, and vegetables.   - Patient is advised to stick to a routine mealtimes to eat 3 meals a day and avoid unnecessary snacks (to snack only to correct hypoglycemia).  - He is advised to continue Metformin 1000 mg po twice daily with meals and Glipizide 10 mg XL daily with breakfast.     -He is willing to continue monitoring blood glucose at least once a day-daily before breakfast and at any other time as needed and to call the clinic if he has readings less than 70 or greater than 200 for 3 tests in a row.    - Patient specific target  A1c;  LDL, HDL, Triglycerides,  were discussed in detail.  2) BP/HTN:  His blood pressure is controlled to target.  He is allergic to ACE inhibitors.  He is advised to restart Losartan- HCT 100-25 mg po daily.   3) Lipids/HPL:  His most recent lipid panel from 11/12/21 shows controlled LDL of 98.   He is advised to continue his Lipitor 20 mg po daily at bedtime.  Side effects and precautions discussed with him.  Will recheck prior to next visit.  4)  Weight/Diet:  His Body mass index is 32.36 kg/m.   I discussed with him the fact that loss of 5 - 10% of his  current body weight will have the most impact on his diabetes management.  CDE Consult will be initiated . Exercise, and detailed carbohydrates information provided  -  detailed on discharge instructions.  5) Vitamin D deficiency:  His most recent vitamin D level was 18.3 on 11/12/21.  He is taking OTC Vitamin D3 5000 units daily. He has been on Ergocalciferol 50000 units PO weekly , will give 1 refill and check his Vitamin D prior to next visit.  6) Chronic Care/Health Maintenance: -he is on ARB and statin medications and is encouraged to initiate and continue to follow up with Ophthalmology, Dentist,  Podiatrist at least yearly or according to recommendations, and advised to stay away from smoking. I have recommended yearly flu vaccine and pneumonia vaccine at least every 5 years; moderate intensity exercise for up to 150 minutes weekly; and  sleep for at least 7 hours a day.  - I advised patient to maintain close follow up with Carrolyn Meiers, MD for primary care needs.     I spent 42 minutes in the care of the patient today including review of labs from [place], Lipids, Thyroid Function, Hematology (current and previous including abstractions from other facilities); face-to-face time discussing  his  blood glucose readings/logs, discussing hypoglycemia and hyperglycemia episodes and symptoms, medications doses, his options of short and long term treatment based on the latest standards of care / guidelines;  discussion about incorporating lifestyle medicine;  and documenting the encounter. Risk reduction counseling performed per USPSTF guidelines to reduce obesity and cardiovascular risk factors.     Please refer to Patient Instructions for Blood Glucose Monitoring and Insulin/Medications Dosing Guide"  in media tab for additional information. Please  also refer to " Patient Self Inventory" in the Media  tab for reviewed elements of  pertinent patient history.  Martin Stevenson participated in the discussions, expressed understanding, and voiced agreement with the above plans.  All questions were answered to his satisfaction. he is encouraged to contact clinic should he have any questions or concerns prior to his return visit.   Follow up plan: - Return in about 3 months (around 11/20/2022) for Diabetes F/U with A1c in office, Previsit labs, Bring meter and logs.  Ronny Bacon, [facility] [address] [place], [place] [address] Phone: [phone] Fax: [phone]  08/21/2022, 3:55 PM

## 2022-11-19 LAB — COMPREHENSIVE METABOLIC PANEL
ALT: 12 IU/L (ref 0–44)
AST: 12 IU/L (ref 0–40)
Albumin/Globulin Ratio: 1.6 (ref 1.2–2.2)
Albumin: 4.4 g/dL (ref 4.1–5.1)
Alkaline Phosphatase: 43 IU/L — ABNORMAL LOW (ref 44–121)
BUN/Creatinine Ratio: 13 (ref 9–20)
BUN: 12 mg/dL (ref 6–20)
Bilirubin Total: 0.4 mg/dL (ref 0.0–1.2)
CO2: 23 mmol/L (ref 20–29)
Calcium: 9.7 mg/dL (ref 8.7–10.2)
Chloride: 100 mmol/L (ref 96–106)
Creatinine, Ser: 0.9 mg/dL (ref 0.76–1.27)
Globulin, Total: 2.8 g/dL (ref 1.5–4.5)
Glucose: 126 mg/dL — ABNORMAL HIGH (ref 70–99)
Potassium: 3.8 mmol/L (ref 3.5–5.2)
Sodium: 140 mmol/L (ref 134–144)
Total Protein: 7.2 g/dL (ref 6.0–8.5)
eGFR: 112 mL/min/{1.73_m2} (ref >59)

## 2022-11-19 LAB — TSH: TSH: 2.95 u[IU]/mL (ref 0.450–4.500)

## 2022-11-19 LAB — LIPID PANEL
Chol/HDL Ratio: 2.9 ratio (ref 0.0–5.0)
Cholesterol, Total: 134 mg/dL (ref 100–199)
HDL: 47 mg/dL (ref >39)
LDL Chol Calc (NIH): 68 mg/dL (ref 0–99)
Triglycerides: 105 mg/dL (ref 0–149)
VLDL Cholesterol Cal: 19 mg/dL (ref 5–40)

## 2022-11-19 LAB — T4, FREE: Free T4: 1.39 ng/dL (ref 0.82–1.77)

## 2022-11-19 LAB — VITAMIN D 25 HYDROXY (VIT D DEFICIENCY, FRACTURES): Vit D, 25-Hydroxy: 64 ng/mL (ref 30.0–100.0)

## 2022-11-20 ENCOUNTER — Encounter: Payer: Self-pay | Admitting: Nurse Practitioner

## 2022-11-20 ENCOUNTER — Ambulatory Visit (INDEPENDENT_AMBULATORY_CARE_PROVIDER_SITE_OTHER): Payer: Medicaid Other | Admitting: Nurse Practitioner

## 2022-11-20 VITALS — BP 110/73 | HR 84 | Ht 67.0 in | Wt 205.8 lb

## 2022-11-20 DIAGNOSIS — E559 Vitamin D deficiency, unspecified: Secondary | ICD-10-CM | POA: Diagnosis not present

## 2022-11-20 DIAGNOSIS — E1165 Type 2 diabetes mellitus with hyperglycemia: Secondary | ICD-10-CM

## 2022-11-20 DIAGNOSIS — E782 Mixed hyperlipidemia: Secondary | ICD-10-CM | POA: Diagnosis not present

## 2022-11-20 LAB — POCT GLYCOSYLATED HEMOGLOBIN (HGB A1C): Hemoglobin A1C: 9.7 % — AB (ref 4.0–5.6)

## 2022-11-20 MED ORDER — ACCU-CHEK GUIDE VI STRP: ORAL_STRIP | 12 refills | Status: DC

## 2022-11-20 NOTE — Progress Notes (Signed)
11/20/2022, 10:14 AM   Endocrinology follow-up note   Subjective:    Patient ID: Martin Stevenson, male    DOB: March 22, 1986.  Martin Stevenson is being seen in follow-up  for management of currently uncontrolled symptomatic diabetes, hyperlipidemia, hypertension.  PMD:  Benetta Spar, MD.   Past Medical History:  Diagnosis Date   Diabetes    Fluid retention    Hypertension    Multiple sclerosis 12/29/2017   Seizures    Past Surgical History:  Procedure Laterality Date   MOUTH SURGERY     x3, jaw, hip, chin   Social History   Socioeconomic History   Marital status: Single    Spouse name: Not on file   Number of children: 3   Years of education: HS   Highest education level: Not on file  Occupational History    Comment: Unemployed  Tobacco Use   Smoking status: Never   Smokeless tobacco: Never  Vaping Use   Vaping Use: Never used  Substance and Sexual Activity   Alcohol use: No    Comment: Quit: 2012   Drug use: No   Sexual activity: Not on file  Other Topics Concern   Not on file  Social History Narrative   Pt lives at home with his spouse.   Caffeine Use: very little   Right handed    Social Determinants of Health   Financial Resource Strain: Not on file  Food Insecurity: Not on file  Transportation Needs: Not on file  Physical Activity: Not on file  Stress: Not on file  Social Connections: Not on file   Outpatient Encounter Medications as of 11/20/2022  Medication Sig   atorvastatin (LIPITOR) 20 MG tablet Take 1 tablet (20 mg total) by mouth daily.   Blood Glucose Monitoring Suppl (ACCU-CHEK GUIDE) w/Device KIT Use to monitor glucose once daily before breakfast   cholecalciferol (VITAMIN D3) 25 MCG (1000 UNIT) tablet Take 1,000 Units by mouth daily.   divalproex (DEPAKOTE) 500 MG DR tablet 2 tablets in the morning, 3 in the evening   gabapentin (NEURONTIN) 600 MG  tablet Take 1 tablet (600 mg total) by mouth at bedtime.   glipiZIDE (GLUCOTROL XL) 5 MG 24 hr tablet Take 2 tablets (10 mg total) by mouth daily with breakfast. TAKE 2 TABLETS BY MOUTH ONCE DAILY WITH BREAKFAST   levETIRAcetam (KEPPRA) 750 MG tablet Take 2 tablets twice daily   losartan-hydrochlorothiazide (HYZAAR) 100-25 MG tablet Take 1 tablet by mouth daily.   metFORMIN (GLUCOPHAGE) 1000 MG tablet Take 1 tablet (1,000 mg total) by mouth 2 (two) times daily with a meal.   Siponimod Fumarate (MAYZENT) 2 MG TABS Take 2 mg by mouth daily.   Vitamin D, Ergocalciferol, (DRISDOL) 1.25 MG (50000 UNIT) CAPS capsule Take 1 capsule (50,000 Units total) by mouth once a week.   [DISCONTINUED] glucose blood (ACCU-CHEK GUIDE) test strip Use as instructed to monitor glucose once daily before breakfast.   glucose blood (ACCU-CHEK GUIDE) test strip Use as instructed to monitor glucose once daily before breakfast.   No facility-administered encounter medications on file as of 11/20/2022.    ALLERGIES: Allergies  Allergen Reactions  Benazepril     States that it made his mouth burn   Shellfish Allergy     VACCINATION STATUS:  There is no immunization history on file for this patient.  Diabetes He presents for his follow-up diabetic visit. He has type 2 diabetes mellitus. Onset time: He was diagnosed at approximate age of 37 years. His disease course has been worsening. There are no hypoglycemic associated symptoms. Pertinent negatives for hypoglycemia include no confusion, headaches, pallor or seizures. Pertinent negatives for diabetes include no chest pain, no fatigue, no polydipsia, no polyphagia, no polyuria and no weakness. There are no hypoglycemic complications. Symptoms are stable. There are no diabetic complications. Risk factors for coronary artery disease include diabetes mellitus, male sex, obesity and sedentary lifestyle. Current diabetic treatment includes oral agent (dual therapy). He is  compliant with treatment most of the time. His weight is decreasing steadily. He is following a generally unhealthy diet. When asked about meal planning, he reported none. He has not had a previous visit with a dietitian. He participates in exercise intermittently. His home blood glucose trend is fluctuating minimally. His breakfast blood glucose range is generally 140-180 mg/dl. His overall blood glucose range is 140-180 mg/dl. (He presents today with his meter, no logs, showing inconsistent glucose monitoring and slightly above target glycemic profile overall.  His POCT A1c today is 9.7%, increasing from last visit of 9.6%.  He admits he still drinks sweetened beverages.  He denies any hypoglycemia. ) An Martin inhibitor/angiotensin II receptor blocker is being taken. He does not see a podiatrist.Eye exam is current.  Hyperlipidemia This is a chronic problem. The current episode started more than 1 year ago. The problem is controlled. Recent lipid tests were reviewed and are normal. Exacerbating diseases include diabetes. Factors aggravating his hyperlipidemia include thiazides. Pertinent negatives include no chest pain. Current antihyperlipidemic treatment includes statins. The current treatment provides mild improvement of lipids. There are no compliance problems.  Risk factors for coronary artery disease include diabetes mellitus, dyslipidemia, male sex, obesity, a sedentary lifestyle and family history.  Hypertension This is a chronic problem. The current episode started more than 1 year ago. The problem is unchanged. The problem is controlled. Pertinent negatives include no chest pain or headaches. There are no associated agents to hypertension. Risk factors for coronary artery disease include diabetes mellitus, dyslipidemia, family history and male gender. Past treatments include angiotensin blockers and diuretics. The current treatment provides mild improvement. There are no compliance problems.       Review of systems  Constitutional: + stable body weight,  current Body mass index is 32.23 kg/m., no fatigue, no subjective hyperthermia, no subjective hypothermia Eyes: no blurry vision, no xerophthalmia ENT: no sore throat, no nodules palpated in throat, no dysphagia/odynophagia, no hoarseness Cardiovascular: no chest pain, no shortness of breath, no palpitations, no leg swelling Respiratory: no cough, no shortness of breath Gastrointestinal: no nausea/vomiting/diarrhea Musculoskeletal: no muscle/joint aches Skin: no rashes, no hyperemia Neurological: no tremors, + numbness/tingling to BLE, no dizziness Psychiatric: no depression, no anxiety   Objective:    BP 110/73 (BP Location: Left Arm, Patient Position: Sitting, Cuff Size: Large)   Pulse 84   Ht  (1.702 m)   Wt 205 lb 12.8 oz (93.4 kg)   BMI 32.23 kg/m   Wt Readings from Last 3 Encounters:  11/20/22 205 lb 12.8 oz (93.4 kg)  08/21/22 206 lb 9.6 oz (93.7 kg)  07/22/22 208 lb 8 oz (94.6 kg)    BP  Readings from Last 3 Encounters:  11/20/22 110/73  08/21/22 119/79  07/22/22 114/78     Physical Exam- Limited  Constitutional:  Body mass index is 32.23 kg/m. , not in acute distress, normal state of mind Eyes:  EOMI, no exophthalmos Musculoskeletal: no gross deformities, strength intact in all four extremities, no gross restriction of joint movements Skin:  no rashes, no hyperemia Neurological: no tremor with outstretched hands  Diabetic Foot Exam - Simple   No data filed     Recent Results (from the past 2160 hour(s))  Comprehensive metabolic panel     Status: Abnormal   Collection Time: 11/18/22  1:33 PM  Result Value Ref Range   Glucose 126 (H) 70 - 99 mg/dL   BUN 12 6 - 20 mg/dL   Creatinine, Ser 4.090.90 0.76 - 1.27 mg/dL   eGFR 811112 >91>59 YN/WGN/5.62mL/min/1.73   BUN/Creatinine Ratio 13 9 - 20   Sodium 140 134 - 144 mmol/L   Potassium 3.8 3.5 - 5.2 mmol/L   Chloride 100 96 - 106 mmol/L   CO2 23 20 - 29  mmol/L   Calcium 9.7 8.7 - 10.2 mg/dL   Total Protein 7.2 6.0 - 8.5 g/dL   Albumin 4.4 4.1 - 5.1 g/dL   Globulin, Total 2.8 1.5 - 4.5 g/dL   Albumin/Globulin Ratio 1.6 1.2 - 2.2   Bilirubin Total 0.4 0.0 - 1.2 mg/dL   Alkaline Phosphatase 43 (L) 44 - 121 IU/L   AST 12 0 - 40 IU/L   ALT 12 0 - 44 IU/L  VITAMIN D 25 Hydroxy (Vit-D Deficiency, Fractures)     Status: None   Collection Time: 11/18/22  1:33 PM  Result Value Ref Range   Vit D, 25-Hydroxy 64.0 30.0 - 100.0 ng/mL    Comment: Vitamin D deficiency has been defined by the Institute of Medicine and an Endocrine Society practice guideline as a level of serum 25-OH vitamin D less than 20 ng/mL (1,2). The Endocrine Society went on to further define vitamin D insufficiency as a level between 21 and 29 ng/mL (2). 1. IOM (Institute of Medicine). 2010. Dietary reference    intakes for calcium and D. [place]: The    [employer]. 2. Holick MF, Binkley [place], Bischoff-Ferrari HA, et al.    Evaluation, treatment, and prevention of vitamin D    deficiency: an Endocrine Society clinical practice    guideline. JCEM. 2011 Jul; 96(7):1911-30.   T4, free     Status: None   Collection Time: 11/18/22  1:33 PM  Result Value Ref Range   Free T4 1.39 0.82 - 1.77 ng/dL  TSH     Status: None   Collection Time: 11/18/22  1:33 PM  Result Value Ref Range   TSH 2.950 0.450 - 4.500 uIU/mL  Lipid panel     Status: None   Collection Time: 11/18/22  1:33 PM  Result Value Ref Range   Cholesterol, Total 134 100 - 199 mg/dL   Triglycerides 130105 0 - 149 mg/dL   HDL 47 >86>39 mg/dL   VLDL Cholesterol Cal 19 5 - 40 mg/dL   LDL Chol Calc (NIH) 68 0 - 99 mg/dL   Chol/HDL Ratio 2.9 0.0 - 5.0 ratio    Comment:                                   T. Chol/HDL Ratio  Men  Women                               1/2 Avg.Risk  3.4    3.3                                   Avg.Risk  5.0    4.4                                 2X Avg.Risk  9.6    7.1                                3X Avg.Risk 23.4   11.0   HgB A1c     Status: Abnormal   Collection Time: 11/20/22  9:52 AM  Result Value Ref Range   Hemoglobin A1C 9.7 (A) 4.0 - 5.6 %   HbA1c POC (<> result, manual entry)     HbA1c, POC (prediabetic range)     HbA1c, POC (controlled diabetic range)          Latest Ref Rng & Units 11/18/2022    1:33 PM 03/13/2022    8:26 AM 11/12/2021   10:37 AM  CMP  Glucose 70 - 99 mg/dL 476  546  503   BUN 6 - 20 mg/dL 12  14  16    Creatinine 0.76 - 1.27 mg/dL 5.46  5.68  1.27   Sodium 134 - 144 mmol/L 140  140  140   Potassium 3.5 - 5.2 mmol/L 3.8  3.9  4.3   Chloride 96 - 106 mmol/L 100  100  99   CO2 20 - 29 mmol/L 23  24  23    Calcium 8.7 - 10.2 mg/dL 9.7  9.5  51.7   Total Protein 6.0 - 8.5 g/dL 7.2  7.0  6.9   Total Bilirubin 0.0 - 1.2 mg/dL 0.4  0.3  0.3   Alkaline Phos 44 - 121 IU/L 43  49  43   AST 0 - 40 IU/L 12  15  11    ALT 0 - 44 IU/L 12  16  9     Lipid Panel     Component Value Date/Time   CHOL 134 11/18/2022 1333   TRIG 105 11/18/2022 1333   HDL 47 11/18/2022 1333   CHOLHDL 2.9 11/18/2022 1333   CHOLHDL 5.1 (H) 03/12/2021 1120   LDLCALC 68 11/18/2022 1333   LDLCALC 137 (H) 03/12/2021 1120   LABVLDL 19 11/18/2022 1333    Assessment & Plan:   1) Uncontrolled type 2 diabetes mellitus with hyperglycemia (HCC)  - Vonte Glock has currently uncontrolled symptomatic type 2 DM since 37 years of age.  He presents today with his meter, no logs, showing inconsistent glucose monitoring and slightly above target glycemic profile overall.  His POCT A1c today is 9.7%, increasing from last visit of 9.6%.  He admits he still drinks sweetened beverages.  He denies any hypoglycemia.  -He does not report any gross complications from his diabetes, however patient with multiple sclerosis as well as seizure disorders,  and he remains at a high risk for more acute and chronic complications which include CAD,  CVA, CKD, retinopathy, and neuropathy. These are all discussed in detail with  him.  - Nutritional counseling repeated at each appointment due to patients tendency to fall back in to old habits.  - The patient admits there is a room for improvement in their diet and drink choices. -  Suggestion is made for the patient to avoid simple carbohydrates from their diet including Cakes, Sweet Desserts / Pastries, Ice Cream, Soda (diet and regular), Sweet Tea, Candies, Chips, Cookies, Sweet Pastries, Store Bought Juices, Alcohol in Excess of 1-2 drinks a day, Artificial Sweeteners, Coffee Creamer, and "Sugar-free" Products. This will help patient to have stable blood glucose profile and potentially avoid unintended weight gain.   - I encouraged the patient to switch to unprocessed or minimally processed complex starch and increased protein intake (animal or plant source), fruits, and vegetables.   - Patient is advised to stick to a routine mealtimes to eat 3 meals a day and avoid unnecessary snacks (to snack only to correct hypoglycemia).  - He is advised to continue Metformin 1000 mg po twice daily with meals and Glipizide 10 mg XL daily with breakfast.   I did discuss the potential need to add on another agent if we cannot regain control on current regimen.  He is absolutely against going on injections.  -He is encouraged to monitor glucose twice daily, before breakfast and before bed, and to call the clinic if he has readings less than 70 or above 300 for 3 tests in a row.  - Patient specific target  A1c;  LDL, HDL, Triglycerides,  were discussed in detail.  2) BP/HTN:  His blood pressure is controlled to target.  He is allergic to Martin inhibitors.  He is advised to continue Losartan- HCT 100-25 mg po daily.   3) Lipids/HPL:  His most recent lipid panel from 11/18/22 shows controlled LDL of 68.   He is advised to continue his Lipitor 20 mg po daily at bedtime.  Side effects and precautions discussed with  him.   4)  Weight/Diet:  His Body mass index is 32.23 kg/m.  I discussed with him the fact that loss of 5 - 10% of his  current body weight will have the most impact on his diabetes management.  CDE Consult will be initiated . Exercise, and detailed carbohydrates information provided  -  detailed on discharge instructions.  5) Vitamin D deficiency:  His most recent vitamin D level was 64 on 11/18/22.  He is currently finishing his Ergocalciferol, once complete, he will not need any additional supplementation.  6) Chronic Care/Health Maintenance: -he is on ARB and statin medications and is encouraged to initiate and continue to follow up with Ophthalmology, Dentist,  Podiatrist at least yearly or according to recommendations, and advised to stay away from smoking. I have recommended yearly flu vaccine and pneumonia vaccine at least every 5 years; moderate intensity exercise for up to 150 minutes weekly; and  sleep for at least 7 hours a day.  - I advised patient to maintain close follow up with Benetta Spar, MD for primary care needs.     I spent  25  minutes in the care of the patient today including review of labs from CMP, Lipids, Thyroid Function, Hematology (current and previous including abstractions from other facilities); face-to-face time discussing  his blood glucose readings/logs, discussing hypoglycemia and hyperglycemia episodes and symptoms, medications doses, his options of short and long term treatment based on the latest standards of care / guidelines;  discussion about incorporating lifestyle medicine;  and documenting the encounter.  Risk reduction counseling performed per USPSTF guidelines to reduce obesity and cardiovascular risk factors.     Please refer to Patient Instructions for Blood Glucose Monitoring and Insulin/Medications Dosing Guide"  in media tab for additional information. Please  also refer to " Patient Self Inventory" in the Media  tab for reviewed  elements of pertinent patient history.  Giovanni Burgo participated in the discussions, expressed understanding, and voiced agreement with the above plans.  All questions were answered to his satisfaction. he is encouraged to contact clinic should he have any questions or concerns prior to his return visit.   Follow up plan: - Return in about 3 months (around 02/19/2023) for Diabetes F/U with A1c in office, No previsit labs, Bring meter and logs.  Ronny Bacon, [facility] [address] [place], [place] [address] Phone: [phone] Fax: [phone]  11/20/2022, 10:14 AM

## 2023-02-10 ENCOUNTER — Ambulatory Visit: Payer: Medicaid Other | Admitting: Neurology

## 2023-02-10 ENCOUNTER — Encounter: Payer: Self-pay | Admitting: Neurology

## 2023-02-10 ENCOUNTER — Telehealth: Payer: Self-pay | Admitting: Neurology

## 2023-02-10 VITALS — BP 122/83 | HR 84 | Ht 67.0 in | Wt 204.5 lb

## 2023-02-10 DIAGNOSIS — R569 Unspecified convulsions: Secondary | ICD-10-CM | POA: Diagnosis not present

## 2023-02-10 DIAGNOSIS — G35 Multiple sclerosis: Secondary | ICD-10-CM | POA: Diagnosis not present

## 2023-02-10 DIAGNOSIS — Z5181 Encounter for therapeutic drug level monitoring: Secondary | ICD-10-CM

## 2023-02-10 MED ORDER — GABAPENTIN 600 MG PO TABS: 600.0000 mg | ORAL_TABLET | Freq: Every day | ORAL | 3 refills | Status: AC

## 2023-02-10 MED ORDER — DIVALPROEX SODIUM 500 MG PO DR TAB: DELAYED_RELEASE_TABLET | ORAL | 3 refills | Status: DC

## 2023-02-10 MED ORDER — LEVETIRACETAM 750 MG PO TABS: ORAL_TABLET | ORAL | 3 refills | Status: DC

## 2023-02-10 NOTE — Progress Notes (Signed)
Patient: Martin Stevenson Date of Birth: 1986/08/07  Reason for Visit: Follow up History from: Patient Primary Neurologist: [facility]  ASSESSMENT AND PLAN 37 y.o. year old male   1.  Relapsing remitting multiple sclerosis -Off Mayzent for the past 2 months, will need to restart titration, long discussion about the importance of medication compliance, staying in touch with our office if having issues getting the medication/prescription/refill.  Increased risk for MS exacerbation with medication nonadherence, on and off DMT -Order MRI of the brain with and without contrast for MS surveillance -Check CBC with differential, CMP -Continue gabapentin 600 mg at bedtime for insomnia, achy pains  2.  Complex partial seizure with secondary generalization -Denies any recent seizure events, continue current dose of Keppra and Depakote -Check blood levels today -Follow-up with me in 6 months or sooner if needed  Meds ordered this encounter  Medications   levETIRAcetam (KEPPRA) 750 MG tablet    Sig: Take 2 tablets twice daily    Dispense:  360 tablet    Refill:  3    Please consider 90 day supplies to promote better adherence   divalproex (DEPAKOTE) 500 MG DR tablet    Sig: 2 tablets in the morning, 3 in the evening    Dispense:  450 tablet    Refill:  3   gabapentin (NEURONTIN) 600 MG tablet    Sig: Take 1 tablet (600 mg total) by mouth at bedtime.    Dispense:  90 tablet    Refill:  3   HISTORY  Martin MooreIs a 37 year old male, following up for Relapsing Remitting Multiple Sclerosis, his primary care physician is Dr. Felecia Shelling, Tes[place]aye he was patient of Dr. Anne Hahn in the past   I reviewed and summarized the referring note. PMHX. 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 Anne Hahn since February 2019 for recurrent seizure, 2 maternal cousins also have seizure, seizure often preceded  by dj vu sensation, then blackout, jerking all fours, with occasionally tongue biting bowel bladder incontinence   He could not tolerate Dilantin in the past," felt like a zombie", he was started on titrating dose of Keppra, Depakote, currently taking Keppra 750 mg 2 tablets twice a day, Depakote DR 500 mg 2 tablets in the morning, 3 at evening, with current dose, his seizure is overall under good control, only couple times a year he will have a smaller spells, most recent one was in November 2023, he had dj vu sensation, lightheaded, mild confusion, lasting for few minutes, did not spread to generalized convulsion   EEG in May 2019 by Dr. Anne Hahn reported sharp with spike-wave activity at the right frontotemporal region     For workup of his seizure, he had MRI of the brain with without contrast in March 2019, noted periventricular subcortical T2/FLAIR hyperintensity lesions, with the possibility of embolus   Lumbar puncture on November 03, 2017 showed more than 5 oligoclonal banding   MRI of cervical spine in March 2019 showed no cord involvement   He was treated with Tecfidera in early 2020, JC virus titer was elevated 3.58 in May 2019, will switch to Mayzent since August 2021 due to repeat MRI of the brain in March 2021 showed new left brainstem lesion   Patient denies any noticeable flareup, denies gait abnormality, on disability, has 4 children, spent most of the time playing with his children,  no driving, denies visual loss,   Personally reviewed most recent MRI of the brain with without contrast March 22, 2021, MS lesions involving left inferior cerebellar peduncle, periventricular and deep white matter, no contrast-enhancement, no change compared to March 2021  Update February 10, 2023 SS: Has not had Mayzent for 2 months due to "not knowing how to fill out the online application". I ordered MRI brain in August 2023, he never had it. Noticing a few MS symptoms, hands, knees ache, knows off  Mayzent. Going to the gym, playing basketball. Is not lazy. Is active. No falls, no balance issues. Some blurry vision, feels needs eye exam. Both arms sometimes feel hard to pick up kids. Has urinary frequency from DM. Seizures doing great, can't even remember last seizure, Depakote 1000/1500 mg, Keppra 1500 mg BID. Takes gabapentin 600 mg at bedtime for sleep, achy pain. Mentions issues with getting to pick the medication up, he doesn't drive, someone has to be home to sign for it. Vitamin D has been fine, stopped it. 11/20/22 A1C 9.7, TSH 2.950,  REVIEW OF SYSTEMS: Out of a complete 14 system review of symptoms, the patient complains only of the following symptoms, and all other reviewed systems are negative.  See HPI  ALLERGIES: Allergies  Allergen Reactions   Benazepril     States that it made his mouth burn   Shellfish Allergy     HOME MEDICATIONS: Outpatient Medications Prior to Visit  Medication Sig Dispense Refill   atorvastatin (LIPITOR) 20 MG tablet Take 1 tablet (20 mg total) by mouth daily. 90 tablet 3   Blood Glucose Monitoring Suppl (ACCU-CHEK GUIDE) w/Device KIT Use to monitor glucose once daily before breakfast 1 kit 0   cholecalciferol (VITAMIN D3) 25 MCG (1000 UNIT) tablet Take 1,000 Units by mouth daily.     glipiZIDE (GLUCOTROL XL) 5 MG 24 hr tablet Take 2 tablets (10 mg total) by mouth daily with breakfast. TAKE 2 TABLETS BY MOUTH ONCE DAILY WITH BREAKFAST 180 tablet 3   glucose blood (ACCU-CHEK GUIDE) test strip Use as instructed to monitor glucose once daily before breakfast. 100 each 12   losartan-hydrochlorothiazide (HYZAAR) 100-25 MG tablet Take 1 tablet by mouth daily. 90 tablet 3   metFORMIN (GLUCOPHAGE) 1000 MG tablet Take 1 tablet (1,000 mg total) by mouth 2 (two) times daily with a meal. 180 tablet 3   Vitamin D, Ergocalciferol, (DRISDOL) 1.25 MG (50000 UNIT) CAPS capsule Take 1 capsule (50,000 Units total) by mouth once a week. 12 capsule 0   divalproex  (DEPAKOTE) 500 MG DR tablet 2 tablets in the morning, 3 in the evening 450 tablet 3   gabapentin (NEURONTIN) 600 MG tablet Take 1 tablet (600 mg total) by mouth at bedtime. 90 tablet 3   levETIRAcetam (KEPPRA) 750 MG tablet Take 2 tablets twice daily 360 tablet 3   Siponimod Fumarate (MAYZENT) 2 MG TABS Take 2 mg by mouth daily. 90 tablet 3   No facility-administered medications prior to visit.    PAST MEDICAL HISTORY: Past Medical History:  Diagnosis Date   Diabetes (HCC)    Fluid retention    Hypertension    Multiple sclerosis (HCC) 12/29/2017   Seizures (HCC)     PAST SURGICAL HISTORY: Past Surgical History:  Procedure Laterality Date   MOUTH SURGERY     x3, jaw, hip, chin    FAMILY HISTORY: Family History  Problem Relation Age of Onset   Diabetes Mother    Pulmonary embolism Mother  hx of blood clots   Diabetes Father     SOCIAL HISTORY: Social History   Socioeconomic History   Marital status: Single    Spouse name: Not on file   Number of children: 4   Years of education: HS   Highest education level: Not on file  Occupational History    Comment: Unemployed  Tobacco Use   Smoking status: Never   Smokeless tobacco: Never  Vaping Use   Vaping Use: Never used  Substance and Sexual Activity   Alcohol use: Yes    Alcohol/week: 1.0 standard drink of alcohol    Types: 1 Standard drinks or equivalent per week    Comment: Quit: 2012   Drug use: No   Sexual activity: Not Currently  Other Topics Concern   Not on file  Social History Narrative   Pt lives at home with his spouse.   Caffeine Use: very little   Right handed    Social Determinants of Health   Financial Resource Strain: Not on file  Food Insecurity: Not on file  Transportation Needs: Not on file  Physical Activity: Not on file  Stress: Not on file  Social Connections: Not on file  Intimate Partner Violence: Not on file   PHYSICAL EXAM  Vitals:   02/10/23 0821  BP: 122/83  Pulse:  84  Weight: 204 lb 8 oz (92.8 kg)  Height: 5\' 7"  (1.702 m)   Body mass index is 32.03 kg/m.  Generalized: Well developed, in no acute distress  Neurological examination  Mentation: Alert oriented to time, place, history taking. Follows all commands speech and language fluent Cranial nerve II-XII: Pupils were equal round reactive to light. Extraocular movements were full, visual field were full on confrontational test. Facial sensation and strength were normal. Head turning and shoulder shrug  were normal and symmetric. Motor: The motor testing reveals 5 over 5 strength of all 4 extremities. Good symmetric motor tone is noted throughout.  Sensory: Sensory testing is intact to soft touch on all 4 extremities. No evidence of extinction is noted.  Coordination: Cerebellar testing reveals good finger-nose-finger and heel-to-shin bilaterally.  Gait and station: Gait is normal. Tandem gait is normal.  Reflexes: Deep tendon reflexes are symmetric and normal bilaterally.   DIAGNOSTIC DATA (LABS, IMAGING, TESTING) - I reviewed patient records, labs, notes, testing and imaging myself where available.  Lab Results  Component Value Date   WBC 4.4 03/13/2022   HGB 13.3 03/13/2022   HCT 41.6 03/13/2022   MCV 85 03/13/2022   PLT 168 03/13/2022      Component Value Date/Time   NA 140 11/18/2022 1333   K 3.8 11/18/2022 1333   CL 100 11/18/2022 1333   CO2 23 11/18/2022 1333   GLUCOSE 126 (H) 11/18/2022 1333   GLUCOSE 299 (H) 03/12/2021 1120   BUN 12 11/18/2022 1333   CREATININE 0.90 11/18/2022 1333   CREATININE 0.89 03/12/2021 1120   CALCIUM 9.7 11/18/2022 1333   PROT 7.2 11/18/2022 1333   ALBUMIN 4.4 11/18/2022 1333   AST 12 11/18/2022 1333   ALT 12 11/18/2022 1333   ALKPHOS 43 (L) 11/18/2022 1333   BILITOT 0.4 11/18/2022 1333   GFRNONAA 108 09/10/2020 1016   GFRNONAA 110 12/21/2019 0830   GFRAA 125 09/10/2020 1016   GFRAA 128 12/21/2019 0830   Lab Results  Component Value Date    CHOL 134 11/18/2022   HDL 47 11/18/2022   LDLCALC 68 11/18/2022   TRIG 105 11/18/2022  CHOLHDL 2.9 11/18/2022   Lab Results  Component Value Date   HGBA1C 9.7 (A) 11/20/2022   No results found for: "VITAMINB12" Lab Results  Component Value Date   TSH 2.950 11/18/2022    Margie Ege, AGNP-C, DNP 02/10/2023, 9:05 AM [facility] [address] [place], [place] [address] [phone]

## 2023-02-10 NOTE — Telephone Encounter (Signed)
Since off for 2 months will need to restart titration. He was on Mayzent 2 mg daily dose.

## 2023-02-10 NOTE — Telephone Encounter (Signed)
Patient has been off Mayzent for 2 months.  Indicates issue with pharmacy and insurance.  Please further investigate and see what is needed on our end.  He will need to restart titration. Thanks

## 2023-02-10 NOTE — Telephone Encounter (Signed)
Called pharmacy, spoke to denise, she states it looks good from the pharmacy end and it says its ready to be filled. We should be able to send in new script and he get it, I am unsure what insurance problem is at this time.

## 2023-02-10 NOTE — Patient Instructions (Signed)
We will work to get you back on Mayzent.  In the future please contact us immediately if you are having issues refilling the Mayzent.  You are high risk for MS exacerbation off the medication and with medication noncompliance.  Check labs today  Continue other medications  Meds ordered this encounter  Medications   levETIRAcetam (KEPPRA) 750 MG tablet    Sig: Take 2 tablets twice daily    Dispense:  360 tablet    Refill:  3    Please consider 90 day supplies to promote better adherence   divalproex (DEPAKOTE) 500 MG DR tablet    Sig: 2 tablets in the morning, 3 in the evening    Dispense:  450 tablet    Refill:  3   gabapentin (NEURONTIN) 600 MG tablet    Sig: Take 1 tablet (600 mg total) by mouth at bedtime.    Dispense:  90 tablet    Refill:  3   Orders Placed This Encounter  Procedures   MR BRAIN W WO CONTRAST   CBC with Differential/Platelet   CMP   Valproic Acid Level   Levetiracetam level

## 2023-02-11 ENCOUNTER — Telehealth: Payer: Self-pay | Admitting: Neurology

## 2023-02-11 DIAGNOSIS — R569 Unspecified convulsions: Secondary | ICD-10-CM

## 2023-02-11 LAB — COMPREHENSIVE METABOLIC PANEL
ALT: 10 IU/L (ref 0–44)
AST: 13 IU/L (ref 0–40)
Albumin: 4.4 g/dL (ref 4.1–5.1)
Alkaline Phosphatase: 48 IU/L (ref 44–121)
BUN/Creatinine Ratio: 12 (ref 9–20)
BUN: 11 mg/dL (ref 6–20)
Bilirubin Total: 0.3 mg/dL (ref 0.0–1.2)
CO2: 25 mmol/L (ref 20–29)
Calcium: 9.6 mg/dL (ref 8.7–10.2)
Chloride: 102 mmol/L (ref 96–106)
Creatinine, Ser: 0.91 mg/dL (ref 0.76–1.27)
Globulin, Total: 2.9 g/dL (ref 1.5–4.5)
Glucose: 63 mg/dL — ABNORMAL LOW (ref 70–99)
Potassium: 3.9 mmol/L (ref 3.5–5.2)
Sodium: 144 mmol/L (ref 134–144)
Total Protein: 7.3 g/dL (ref 6.0–8.5)
eGFR: 111 mL/min/{1.73_m2} (ref >59)

## 2023-02-11 LAB — CBC WITH DIFFERENTIAL/PLATELET
Basophils Absolute: 0 10*3/uL (ref 0.0–0.2)
Basos: 0 %
EOS (ABSOLUTE): 0.1 10*3/uL (ref 0.0–0.4)
Eos: 1 %
Hematocrit: 40.6 % (ref 37.5–51.0)
Hemoglobin: 13.5 g/dL (ref 13.0–17.7)
Immature Grans (Abs): 0 10*3/uL (ref 0.0–0.1)
Immature Granulocytes: 0 %
Lymphocytes Absolute: 2.6 10*3/uL (ref 0.7–3.1)
Lymphs: 44 %
MCH: 27.8 pg (ref 26.6–33.0)
MCHC: 33.3 g/dL (ref 31.5–35.7)
MCV: 84 fL (ref 79–97)
Monocytes Absolute: 0.6 10*3/uL (ref 0.1–0.9)
Monocytes: 10 %
Neutrophils Absolute: 2.7 10*3/uL (ref 1.4–7.0)
Neutrophils: 45 %
Platelets: 226 10*3/uL (ref 150–450)
RBC: 4.85 x10E6/uL (ref 4.14–5.80)
RDW: 13.5 % (ref 11.6–15.4)
WBC: 5.9 10*3/uL (ref 3.4–10.8)

## 2023-02-11 LAB — VALPROIC ACID LEVEL: Valproic Acid Lvl: 165 ug/mL (ref 50–100)

## 2023-02-11 LAB — LEVETIRACETAM LEVEL: Levetiracetam Lvl: 55.9 ug/mL — ABNORMAL HIGH (ref 10.0–40.0)

## 2023-02-11 MED ORDER — TERIFLUNOMIDE 14 MG PO TABS: 14.0000 mg | ORAL_TABLET | Freq: Every day | ORAL | 11 refills | Status: DC

## 2023-02-11 NOTE — Telephone Encounter (Signed)
Switching him to Aubagio per Sarah's note from 02/11/23

## 2023-02-11 NOTE — Telephone Encounter (Signed)
Called patient and relayed message and he states he will come by Monday morning for lab but that he had just taken his med before last blood draw. I also informed him about TB test, Pt had no questions at this time but was encouraged to call back if questions arise. Pt verbalized understanding.

## 2023-02-11 NOTE — Telephone Encounter (Signed)
I tried to call the patient, left a message, please try to reach him.   Depakote level returned very high 165.  Suspect this may be related to timing of medication.  Will need to confirm.  Labs were collected at 8:40 AM yesterday.  Would recommend trough level recheck on Monday 1st thing.  He had no signs of toxicity on exam yesterday, please confirm this is the case today.    He needs to come for a TB check before starting Aubagio.  I sent the prescription in to local pharmacy.  Do not start until TB result comes in.  CBC and CMP were normal with the exception of glucose 63.  With Aubagio he will need monthly LFTs.  He will need to let us know when he starts the Aubagio so we can keep track.   I talked with him yesterday and he was on board with switching to Aubagio, since off Mayzent for atleast 2 months.   Orders Placed This Encounter  Procedures   Valproic Acid Level

## 2023-02-11 NOTE — Addendum Note (Signed)
Addended by: Glean Salvo on: 02/11/2023 11:37 AM   Modules accepted: Orders

## 2023-02-16 ENCOUNTER — Telehealth: Payer: Self-pay | Admitting: Neurology

## 2023-02-16 NOTE — Telephone Encounter (Signed)
[facility]: [id number] exp. 02/16/2023 - 03/18/2023 sent to GI [phone]

## 2023-02-24 ENCOUNTER — Ambulatory Visit: Payer: Medicaid Other | Admitting: Nurse Practitioner

## 2023-02-24 NOTE — Telephone Encounter (Signed)
It does not appear that patient has completed labs yet. I called patient to remind him to complete a trough VPA level and TB test. No answer, left a message asking him to call us back.  If patient calls back, remind him to stop by the office first thing in the morning prior to taking morning dose of depakote to get labs. He needs depakote and TB checked.

## 2023-02-27 ENCOUNTER — Ambulatory Visit: Payer: Medicaid Other | Admitting: Nurse Practitioner

## 2023-02-27 DIAGNOSIS — E782 Mixed hyperlipidemia: Secondary | ICD-10-CM

## 2023-02-27 DIAGNOSIS — E1165 Type 2 diabetes mellitus with hyperglycemia: Secondary | ICD-10-CM

## 2023-02-27 DIAGNOSIS — Z7984 Long term (current) use of oral hypoglycemic drugs: Secondary | ICD-10-CM

## 2023-02-27 DIAGNOSIS — E559 Vitamin D deficiency, unspecified: Secondary | ICD-10-CM

## 2023-02-27 DIAGNOSIS — I10 Essential (primary) hypertension: Secondary | ICD-10-CM

## 2023-03-05 NOTE — Telephone Encounter (Signed)
I left a voicemail for the patient informing him that labs are required. I have requested a return call and provided the office phone number.

## 2023-03-10 NOTE — Telephone Encounter (Signed)
Called and spoke to pt and stated that he is due for labs and he stated that he was well aware.

## 2023-03-25 ENCOUNTER — Telehealth: Payer: Self-pay | Admitting: Neurology

## 2023-03-25 MED ORDER — MAYZENT 2 MG PO TABS: 2.0000 mg | ORAL_TABLET | Freq: Every day | ORAL | 11 refills | Status: DC

## 2023-03-25 NOTE — Telephone Encounter (Signed)
Pt has called to report that he has finished all of his MAYZENT and it helped.  Pt states he has not started his Teriflunomide 14 MG TABSbecause he has not had his TB shot yet, pt is asking for a call to discuss.

## 2023-03-25 NOTE — Telephone Encounter (Signed)
Pt called stating that he received a phone call and was returning the call. Please advise.

## 2023-03-25 NOTE — Telephone Encounter (Signed)
Called and spoke to patient about medications and LVM for CB

## 2023-03-25 NOTE — Addendum Note (Signed)
Addended by: Glean Salvo on: 03/25/2023 12:50 PM   Modules accepted: Orders

## 2023-03-25 NOTE — Telephone Encounter (Signed)
Plan was for him to switch to Aubagio since he had ran out of Mayzent and has been on and off the past several years.  Mayzent is a high risk medication to take intermittently.  If he has since restarted titration pack and is doing well on Mayzent, then he can continue. Please advise him to not come on and off Mayzent. Should make sure he gets his medication refilled in time. I sent Mayzent to covermymeds. He should NOT start the Aubagio. Thanks  Meds ordered this encounter  Medications   Siponimod Fumarate (MAYZENT) 2 MG TABS    Sig: Take 1 tablet (2 mg total) by mouth daily.    Dispense:  30 tablet    Refill:  11

## 2023-03-25 NOTE — Telephone Encounter (Signed)
Spoke to Maralyn Sago verbally, patient needs to be on Mayzent OR aubagio, NOT both. If on mayzent can continue, but if not taking can switch to aubagio BUT has to come in for labs BEFORE starting medication. We need to confirm which drug is or wants to be on and go from there.

## 2023-03-30 NOTE — Telephone Encounter (Signed)
Appears that patient has resumed Mayzent.

## 2023-04-01 NOTE — Telephone Encounter (Signed)
Spoke to pt who stated that he would try his best to come to labs tomorrow! He verbalized understanding that aubagio cannot be started until the labs are completed

## 2023-04-01 NOTE — Telephone Encounter (Signed)
Called and spoke to pt and he stated he has been off mayzent greater than 4 days. He also says Martin Stevenson has already been shipped and received but hasn't started it yet. He says he has transportation issues. I emphasized the importance of completing labs so he can start the new medication.   Routing to provider to make aware

## 2023-04-01 NOTE — Telephone Encounter (Signed)
This is really not a good idea for him to be on and off the medication as he is taking. Verify with the patient that he is been off for more than 4 days. If so, I think we should switch him to Aubagio. He has not come for labs yet. Might be best to ask him to come see me for appointment in the next few days to discuss.

## 2023-04-01 NOTE — Telephone Encounter (Signed)
Martin Stevenson said he is coming for labs tomorrow, we need labs before he can start Aubagio. Thanks

## 2023-04-01 NOTE — Telephone Encounter (Signed)
Received a call from cover my meds regarding patients medication : Siponimod Fumarate (MAYZENT) 2 MG TABS   Per cover my meds patient has had a lapse in doses of approximately 7 days. They are asking if the prescribing provider wants to send in another starter pack for the patient due to the lapse or if it is okay to send out a refill of the maintenance dose.   If a starter pack is recommended, a new script would need to be sent in, either electronically, by fax or verbally to the provider line at #[phone] option 3.  If it is okay to just refill the maintenance dose - please advise them of same.

## 2023-04-02 ENCOUNTER — Ambulatory Visit (INDEPENDENT_AMBULATORY_CARE_PROVIDER_SITE_OTHER): Payer: Self-pay | Admitting: Neurology

## 2023-04-02 ENCOUNTER — Other Ambulatory Visit: Payer: Self-pay

## 2023-04-02 ENCOUNTER — Other Ambulatory Visit: Payer: Self-pay | Admitting: Neurology

## 2023-04-02 ENCOUNTER — Ambulatory Visit
Admission: RE | Admit: 2023-04-02 | Discharge: 2023-04-02 | Disposition: A | Discharge status: Home / Self Care | Payer: Medicaid Other | Source: Ambulatory Visit | Attending: Neurology | Admitting: Neurology

## 2023-04-02 DIAGNOSIS — R569 Unspecified convulsions: Secondary | ICD-10-CM

## 2023-04-02 DIAGNOSIS — G35 Multiple sclerosis: Secondary | ICD-10-CM

## 2023-04-02 MED ORDER — GADOPICLENOL 0.5 MMOL/ML IV SOLN
9.0000 mL | Freq: Once | INTRAVENOUS | Status: AC | PRN
  Administered 2023-04-02: 9 mL via INTRAVENOUS

## 2023-04-02 NOTE — Progress Notes (Signed)
Orders Placed This Encounter  Procedures   CMP   Here for labs today, off Mayzent for about 8 days. Will remain off plan to switch to Aubagio once labs result. Having MRI today, I will call him next week with instructions about starting Aubagio.

## 2023-04-02 NOTE — Telephone Encounter (Signed)
Called and scheduled appt for today to see Martin Stevenson face to face to discuss [place]

## 2023-04-02 NOTE — Progress Notes (Signed)
Here for labs

## 2023-04-03 LAB — COMPREHENSIVE METABOLIC PANEL
ALT: 12 IU/L (ref 0–44)
AST: 15 IU/L (ref 0–40)
Albumin: 4 g/dL — ABNORMAL LOW (ref 4.1–5.1)
Alkaline Phosphatase: 48 IU/L (ref 44–121)
BUN/Creatinine Ratio: 11 (ref 9–20)
BUN: 10 mg/dL (ref 6–20)
Bilirubin Total: 0.3 mg/dL (ref 0.0–1.2)
CO2: 20 mmol/L (ref 20–29)
Calcium: 9.2 mg/dL (ref 8.7–10.2)
Chloride: 102 mmol/L (ref 96–106)
Creatinine, Ser: 0.92 mg/dL (ref 0.76–1.27)
Globulin, Total: 2.6 g/dL (ref 1.5–4.5)
Glucose: 383 mg/dL — ABNORMAL HIGH (ref 70–99)
Potassium: 4.4 mmol/L (ref 3.5–5.2)
Sodium: 139 mmol/L (ref 134–144)
Total Protein: 6.6 g/dL (ref 6.0–8.5)
eGFR: 109 mL/min/{1.73_m2} (ref >59)

## 2023-04-05 LAB — QUANTIFERON-TB GOLD PLUS
QuantiFERON Mitogen Value: >10 [IU]/mL
QuantiFERON Nil Value: 0.02 [IU]/mL
QuantiFERON TB1 Ag Value: 0.02 [IU]/mL
QuantiFERON TB2 Ag Value: 0.01 [IU]/mL
QuantiFERON-TB Gold Plus: NEGATIVE

## 2023-04-05 LAB — VALPROIC ACID LEVEL: Valproic Acid Lvl: 108 ug/mL — ABNORMAL HIGH (ref 50–100)

## 2023-04-06 ENCOUNTER — Telehealth: Payer: Self-pay | Admitting: Neurology

## 2023-04-06 DIAGNOSIS — G35 Multiple sclerosis: Secondary | ICD-10-CM

## 2023-04-06 NOTE — Addendum Note (Signed)
Addended by: Danne Harbor on: 04/06/2023 03:17 PM   Modules accepted: Orders

## 2023-04-06 NOTE — Telephone Encounter (Signed)
Pt aware to start aubagio I was unable to add order

## 2023-04-06 NOTE — Telephone Encounter (Signed)
Personally reviewed MRI of the brain, stable from previous scan in August 2022  Agree to switch aubagio to increase compliance  IMPRESSION:   This MRI of the brain with and without contrast shows the following: Scattered T2/FLAIR hyperintense foci in the cerebral hemispheres, predominantly in the periventricular white matter.  An additional focus is noted in the left inferior cerebellar peduncle.  None of the foci appear to be acute.  They do not enhance.  No new lesions compared to the MRI from 03/22/2021. Normal enhancement pattern.  No acute findings.

## 2023-04-06 NOTE — Telephone Encounter (Signed)
Patient has been off Mayzent for about 2 weeks.  Labs last week 8/22 CMP glucose 383, normal AST ALT.  Negative QuantiFERON.  Depakote level 108.  CBC was normal 02/10/23.  Planning to switch to generic Aubagio.  Over the last few years multiple episodes of starting/stopping Mayzent, high risk medication to be on and off.  Once starting Aubagio will need monthly LFTs. I think he is ok to start Aubagio but will verify with Dr. Terrace Arabia. Thanks  Orders Placed This Encounter  Procedures   Hepatic Function Panel

## 2023-04-07 MED ORDER — TERIFLUNOMIDE 14 MG PO TABS: 14.0000 mg | ORAL_TABLET | Freq: Every day | ORAL | 5 refills | Status: DC

## 2023-04-07 NOTE — Addendum Note (Signed)
Addended by: Geronimo Running A on: 04/07/2023 03:17 PM   Modules accepted: Orders

## 2023-04-07 NOTE — Telephone Encounter (Signed)
I called [facility] in [place].  They are unable to get teriflunomide 14 mg tablets.  I will check with other specialty pharmacies to find out where to send history for my prescription.

## 2023-04-07 NOTE — Telephone Encounter (Signed)
[facility] will try to help patient. RX for teriflunomide 14mg  sent to [facility].  Terifunomide is an [place] Medicaid preferred drug, unsure if it will need PA.  Will continue to follow.

## 2023-04-07 NOTE — Addendum Note (Signed)
Addended by: Geronimo Running A on: 04/07/2023 04:43 PM   Modules accepted: Orders

## 2023-04-21 MED ORDER — TERIFLUNOMIDE 14 MG PO TABS: 14.0000 mg | ORAL_TABLET | Freq: Every day | ORAL | 5 refills | Status: DC

## 2023-04-21 NOTE — Addendum Note (Signed)
Addended by: Geronimo Running A on: 04/21/2023 12:15 PM   Modules accepted: Orders

## 2023-04-21 NOTE — Telephone Encounter (Signed)
I called patient. He reports that [facility] shipped him the teriflunomide and he has started taking it and tolerating it well. He confirms that he is no longer taking Mayzent. He is aware that he will need to come for LFTs once monthly for the next 6 months. He will come by our office in early October for labs.

## 2023-05-03 ENCOUNTER — Other Ambulatory Visit: Payer: Self-pay | Admitting: Nurse Practitioner

## 2023-05-05 ENCOUNTER — Telehealth: Payer: Self-pay | Admitting: Neurology

## 2023-05-05 DIAGNOSIS — G35 Multiple sclerosis: Secondary | ICD-10-CM

## 2023-05-05 NOTE — Telephone Encounter (Signed)
He needs labs monthly since starting generic Aubagio. Started end of August/early September. Have him plan to come next week for LFT. Order is in (as standing). Thanks

## 2023-05-05 NOTE — Telephone Encounter (Signed)
Called and spoke to pt and stated get labs next week and he verbalized understanding and wanted to complete in [place]. I said he should be able to get them done there if they do not receive them to call me while there and I can fax orders to that [facility] location. Pt voiced gratitude and understanding

## 2023-05-11 ENCOUNTER — Other Ambulatory Visit: Payer: Self-pay

## 2023-05-11 DIAGNOSIS — G35 Multiple sclerosis: Secondary | ICD-10-CM

## 2023-05-12 LAB — HEPATIC FUNCTION PANEL
ALT: 27 [IU]/L (ref 0–44)
AST: 23 [IU]/L (ref 0–40)
Albumin: 4.8 g/dL (ref 4.1–5.1)
Alkaline Phosphatase: 56 [IU]/L (ref 44–121)
Bilirubin Total: 0.7 mg/dL (ref 0.0–1.2)
Bilirubin, Direct: 0.24 mg/dL (ref 0.00–0.40)
Total Protein: 7.8 g/dL (ref 6.0–8.5)

## 2023-06-08 ENCOUNTER — Other Ambulatory Visit: Payer: Self-pay | Admitting: Nurse Practitioner

## 2023-06-08 ENCOUNTER — Telehealth: Payer: Self-pay | Admitting: Neurology

## 2023-06-08 NOTE — Telephone Encounter (Signed)
Call to patient, he states he will have labs done at [place] [facility] as he d1d last time. Patient appreciative of call

## 2023-06-08 NOTE — Telephone Encounter (Signed)
Please call, needs to come this week for labs on Teriflunomide (generic Aubagio). Orders should be in a standing. Thanks

## 2023-06-18 ENCOUNTER — Telehealth: Payer: Self-pay

## 2023-06-18 LAB — HEPATIC FUNCTION PANEL
ALT: 16 [IU]/L (ref 0–44)
AST: 14 [IU]/L (ref 0–40)
Albumin: 4.3 g/dL (ref 4.1–5.1)
Alkaline Phosphatase: 53 [IU]/L (ref 44–121)
Bilirubin Total: 0.3 mg/dL (ref 0.0–1.2)
Bilirubin, Direct: 0.12 mg/dL (ref 0.00–0.40)
Total Protein: 6.8 g/dL (ref 6.0–8.5)

## 2023-06-18 NOTE — Telephone Encounter (Signed)
Patient returned your call.

## 2023-06-18 NOTE — Telephone Encounter (Signed)
1st attempt lmtrc

## 2023-06-18 NOTE — Telephone Encounter (Signed)
-----   Message from Glean Salvo sent at 06/18/2023  5:43 AM EST ----- Please call, liver function is normal.  Will recheck in 1 month.  Thanks, Maralyn Sago

## 2023-06-18 NOTE — Telephone Encounter (Signed)
Called and relayed normal result to pt and he voiced gratitude and understanding

## 2023-07-13 ENCOUNTER — Telehealth: Payer: Self-pay | Admitting: Neurology

## 2023-07-13 NOTE — Telephone Encounter (Signed)
Called and spoke to pt and stated needed to come to complete labs this week. Pt voiced gratitude and understanding

## 2023-07-13 NOTE — Telephone Encounter (Signed)
Please call to ask to come for labs on Augagio. Thanks

## 2023-07-20 ENCOUNTER — Telehealth: Payer: Self-pay | Admitting: Neurology

## 2023-07-20 DIAGNOSIS — G35 Multiple sclerosis: Secondary | ICD-10-CM

## 2023-07-20 NOTE — Telephone Encounter (Signed)
Called pt and stated that I would be happy to fax the orders and he stated when he goes back he will call us and provide the fax number

## 2023-07-20 NOTE — Telephone Encounter (Signed)
Martin Stevenson from [facility] they do not have orders for monthly labs. Pt is at the facility. Requesting call back

## 2023-07-21 LAB — HEPATIC FUNCTION PANEL
ALT: 16 [IU]/L (ref 0–44)
AST: 12 [IU]/L (ref 0–40)
Albumin: 4.7 g/dL (ref 4.1–5.1)
Alkaline Phosphatase: 54 [IU]/L (ref 44–121)
Bilirubin Total: 0.4 mg/dL (ref 0.0–1.2)
Bilirubin, Direct: 0.17 mg/dL (ref 0.00–0.40)
Total Protein: 7.4 g/dL (ref 6.0–8.5)

## 2023-08-17 ENCOUNTER — Telehealth: Payer: Self-pay | Admitting: Neurology

## 2023-08-17 NOTE — Telephone Encounter (Signed)
 Please call, he needs to come this week for labs on Aubagio. Thanks

## 2023-08-17 NOTE — Telephone Encounter (Signed)
 I spoke with the patient. He states he has been getting lab work done at [facility] in [place] without issue. He will go there to get labs done sometime this week.

## 2023-08-20 ENCOUNTER — Other Ambulatory Visit: Payer: Self-pay

## 2023-08-20 DIAGNOSIS — G35 Multiple sclerosis: Secondary | ICD-10-CM

## 2023-08-20 NOTE — Telephone Encounter (Signed)
 Pt requesting order for blood work sent to [facility] in [place]. Pt said  on his way to get labs done.

## 2023-08-20 NOTE — Telephone Encounter (Addendum)
 Hepatic panel order standing

## 2023-08-20 NOTE — Addendum Note (Signed)
 Addended by: Lenn Cal on: 08/20/2023 09:15 AM   Modules accepted: Orders

## 2023-08-21 LAB — HEPATIC FUNCTION PANEL
ALT: 9 [IU]/L (ref 0–44)
AST: 13 [IU]/L (ref 0–40)
Albumin: 4 g/dL — ABNORMAL LOW (ref 4.1–5.1)
Alkaline Phosphatase: 52 [IU]/L (ref 44–121)
Bilirubin Total: 0.3 mg/dL (ref 0.0–1.2)
Bilirubin, Direct: 0.14 mg/dL (ref 0.00–0.40)
Total Protein: 6.6 g/dL (ref 6.0–8.5)

## 2023-08-25 NOTE — Progress Notes (Signed)
 Patient: Martin Stevenson Date of Birth: [birth year]  Reason for Visit: Follow up History from: Patient Primary Neurologist: [name]  ASSESSMENT AND PLAN 38 y.o. year old male   1.  Relapsing remitting multiple sclerosis -On Aubagio  since September 2024, tolerating well, potentially some peripheral neuropathy symptoms however not clear strictly, Aubagio  as A1c is in the 9's, recently started amlodipine -Plan to check MRI of the brain with and without contrast in 6 months for MS surveillance -Previously treated with Mayzent  but had poor compliance, thus switch to Aubagio  -He was started on Tecfidera  for MS in 2019, switch to Mayzent  August 2021 when MRI showed new lesion, unclear timing of lesion MRI interval was March 2019-February 2021 -Continue gabapentin  600 mg at bedtime for insomnia, achy pains -He has 1 more month of monthly LFTs, thus far have been normal  2.  Complex partial seizure with secondary generalization -Small partial seizure type event less than 5 seconds several months ago.  Historically reports about 1-2 episodes a year.  Wishes to continue current seizure regimen Keppra  750 mg, 2 tablets twice daily, Depakote  DR 500 mg 2/3 tablets daily  -July 2024 Keppra  level 55.9, August 2024 Depakote  level 108 -We discussed VNS, he is not interested.  If seizure events continue would add on Onfi, will draw a trough levels of Keppra  and Depakote  level next month when he goes for MS labs. -He does not have a driver's license, we discussed he is not supposed to drive car especially with seizure history -Previously treated with Dilantin  -Follow-up with me in 6 months or sooner if needed  Meds ordered this encounter  Medications   levETIRAcetam  (KEPPRA ) 750 MG tablet    Sig: Take 2 tablets twice daily    Dispense:  360 tablet    Refill:  3    Please consider 90 day supplies to promote better adherence   divalproex  (DEPAKOTE ) 500 MG DR tablet    Sig: 2 tablets in the morning,  3 in the evening    Dispense:  450 tablet    Refill:  3   HISTORY  Martin MooreIs a 38 year old male, following up for Relapsing Remitting Multiple Sclerosis, his primary care physician is Dr. Eldon Greenland, Tesfaye he was patient of Dr. Tilda Fogo in the past   I reviewed and summarized the referring note. PMHX. 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 Tilda Fogo since February 2019 for recurrent seizure, 2 maternal cousins also have seizure, seizure often preceded by dj vu sensation, then blackout, jerking all fours, with occasionally tongue biting bowel bladder incontinence   He could not tolerate Dilantin in the past," felt like a zombie", he was started on titrating dose of Keppra , Depakote , currently taking Keppra  750 mg 2 tablets twice a day, Depakote  DR 500 mg 2 tablets in the morning, 3 at evening, with current dose, his seizure is overall under good control, only couple times a year he will have a smaller spells, most recent one was in November 2023, he had dj vu sensation, lightheaded, mild confusion, lasting for few minutes, did not spread to generalized convulsion   EEG in May 2019 by Dr. Tilda Fogo reported sharp with spike-wave activity at the right frontotemporal region     For workup of his seizure, he had MRI of the brain with without contrast in March 2019, noted periventricular subcortical T2/FLAIR hyperintensity lesions, with  the possibility of embolus   Lumbar puncture on November 03, 2017 showed more than 5 oligoclonal banding   MRI of cervical spine in March 2019 showed no cord involvement   He was treated with Tecfidera  in early 2020, JC virus titer was elevated 3.58 in May 2019, will switch to Mayzent  since August 2021 due to repeat MRI of the brain in March 2021 showed new left brainstem lesion   Patient denies any noticeable flareup, denies gait abnormality, on disability,  has 4 children, spent most of the time playing with his children, no driving, denies visual loss,   Personally reviewed most recent MRI of the brain with without contrast March 22, 2021, MS lesions involving left inferior cerebellar peduncle, periventricular and deep white matter, no contrast-enhancement, no change compared to March 2021  Update February 10, 2023 SS: Has not had Mayzent  for 2 months due to "not knowing how to fill out the online application". I ordered MRI brain in August 2023, he never had it. Noticing a few MS symptoms, hands, knees ache, knows off Mayzent . Going to the gym, playing basketball. Is not lazy. Is active. No falls, no balance issues. Some blurry vision, feels needs eye exam. Both arms sometimes feel hard to pick up kids. Has urinary frequency from DM. Seizures doing great, can't even remember last seizure, Depakote  1000/1500 mg, Keppra  1500 mg BID. Takes gabapentin  600 mg at bedtime for sleep, achy pain. Mentions issues with getting to pick the medication up, he doesn't drive, someone has to be home to sign for it. Vitamin D  has been fine, stopped it. 11/20/22 A1C 9.7, TSH 2.950,  Update August 26, 2023 SS: Started Aubagio  September 2024. Had noted some tingling in his feet about a month, started amlodipine around the time, A1C 9's. Reports seizure 3 months ago, stared off, lasted about 5 seconds, had not missed any dose of medication, felt it coming on, under a lot of stress, remains on Keppra  750 mg 2 tablets twice daily, Depakote  DR 500 mg 2/3. Has not missed any doses. PCP follows Vitamin D , told looks good. Feels MS stable.   REVIEW OF SYSTEMS: Out of a complete 14 system review of symptoms, the patient complains only of the following symptoms, and all other reviewed systems are negative.  See HPI  ALLERGIES: Allergies  Allergen Reactions   Benazepril     States that it made his mouth burn   Shellfish Allergy     HOME MEDICATIONS: Outpatient Medications Prior to  Visit  Medication Sig Dispense Refill   amLODipine (NORVASC) 2.5 MG tablet Take 2.5 mg by mouth daily.     atorvastatin  (LIPITOR) 20 MG tablet Take 1 tablet (20 mg total) by mouth daily. 90 tablet 3   Blood Glucose Monitoring Suppl (ACCU-CHEK GUIDE) w/Device KIT Use to monitor glucose once daily before breakfast 1 kit 0   cholecalciferol (VITAMIN D3) 25 MCG (1000 UNIT) tablet Take 1,000 Units by mouth daily.     divalproex  (DEPAKOTE ) 500 MG DR tablet 2 tablets in the morning, 3 in the evening 450 tablet 3   gabapentin  (NEURONTIN ) 600 MG tablet Take 1 tablet (600 mg total) by mouth at bedtime. 90 tablet 3   glipiZIDE  (GLUCOTROL  XL) 5 MG 24 hr tablet TAKE 2 TABLETS BY MOUTH ONCE DAILY WITH BREAKFAST 180 tablet 0   glucose blood (ACCU-CHEK GUIDE) test strip USE AS DIRECTED TO MONITOR GLUCOSE ONCE DAILY BEFORE BREAKFST. 50 each 0   levETIRAcetam  (KEPPRA ) 750 MG tablet Take 2  tablets twice daily 360 tablet 3   losartan -hydrochlorothiazide  (HYZAAR) 100-25 MG tablet Take 1 tablet by mouth daily. 90 tablet 3   metFORMIN  (GLUCOPHAGE ) 1000 MG tablet Take 1 tablet (1,000 mg total) by mouth 2 (two) times daily with a meal. 180 tablet 3   Teriflunomide  14 MG TABS Take 1 tablet (14 mg total) by mouth daily. 30 tablet 5   Vitamin D , Ergocalciferol , (DRISDOL ) 1.25 MG (50000 UNIT) CAPS capsule Take 1 capsule (50,000 Units total) by mouth once a week. 12 capsule 0   No facility-administered medications prior to visit.    PAST MEDICAL HISTORY: Past Medical History:  Diagnosis Date   Diabetes (HCC)    Fluid retention    Hypertension    Multiple sclerosis (HCC) 12/29/2017   Seizures (HCC)     PAST SURGICAL HISTORY: Past Surgical History:  Procedure Laterality Date   MOUTH SURGERY     x3, jaw, hip, chin    FAMILY HISTORY: Family History  Problem Relation Age of Onset   Diabetes Mother    Pulmonary embolism Mother        hx of blood clots   Diabetes Father     SOCIAL HISTORY: Social History    Socioeconomic History   Marital status: Single    Spouse name: Not on file   Number of children: 4   Years of education: HS   Highest education level: Not on file  Occupational History    Comment: Unemployed  Tobacco Use   Smoking status: Never   Smokeless tobacco: Never  Vaping Use   Vaping status: Never Used  Substance and Sexual Activity   Alcohol use: Yes    Alcohol/week: 1.0 standard drink of alcohol    Types: 1 Standard drinks or equivalent per week    Comment: Quit: 2012   Drug use: No   Sexual activity: Not Currently  Other Topics Concern   Not on file  Social History Narrative   Pt lives at home with his spouse.   Caffeine Use: very little   Right handed    Social Drivers of Corporate investment banker Strain: Not on file  Food Insecurity: Not on file  Transportation Needs: Not on file  Physical Activity: Not on file  Stress: Not on file  Social Connections: Unknown (12/14/2021)   [facility]   Social Network    Social Network: Not on file  Intimate Partner Violence: Unknown (11/12/2021)   Received from [facility], [facility]   HITS    Physically Hurt: Not on file    Insult or Talk Down To: Not on file    Threaten Physical Harm: Not on file    Scream or Curse: Not on file   PHYSICAL EXAM  Vitals:   08/26/23 0831  BP: 124/79  Pulse: 86  Weight: 190 lb (86.2 kg)  Height: 5\' 7"  (1.702 m)    Body mass index is 29.76 kg/m.  Generalized: Well developed, in no acute distress  Neurological examination  Mentation: Alert oriented to time, place, history taking. Follows all commands speech and language fluent Cranial nerve II-XII: Pupils were equal round reactive to light. Extraocular movements were full, visual field were full on confrontational test. Facial sensation and strength were normal. Head turning and shoulder shrug  were normal and symmetric. Motor: The motor testing reveals 5 over 5 strength of all 4  extremities. Good symmetric motor tone is noted throughout.  Sensory: Sensory testing is intact to  soft touch on all 4 extremities. No evidence of extinction is noted.  Coordination: Cerebellar testing reveals good finger-nose-finger and heel-to-shin bilaterally.  Gait and station: Gait is normal. Tandem gait is normal.  Reflexes: Deep tendon reflexes are symmetric and normal bilaterally.   DIAGNOSTIC DATA (LABS, IMAGING, TESTING) - I reviewed patient records, labs, notes, testing and imaging myself where available.  Lab Results  Component Value Date   WBC 5.9 02/10/2023   HGB 13.5 02/10/2023   HCT 40.6 02/10/2023   MCV 84 02/10/2023   PLT 226 02/10/2023      Component Value Date/Time   NA 139 04/02/2023 1106   K 4.4 04/02/2023 1106   CL 102 04/02/2023 1106   CO2 20 04/02/2023 1106   GLUCOSE 383 (H) 04/02/2023 1106   GLUCOSE 299 (H) 03/12/2021 1120   BUN 10 04/02/2023 1106   CREATININE 0.92 04/02/2023 1106   CREATININE 0.89 03/12/2021 1120   CALCIUM  9.2 04/02/2023 1106   PROT 6.6 08/20/2023 0937   ALBUMIN 4.0 (L) 08/20/2023 0937   AST 13 08/20/2023 0937   ALT 9 08/20/2023 0937   ALKPHOS 52 08/20/2023 0937   BILITOT 0.3 08/20/2023 0937   GFRNONAA 108 09/10/2020 1016   GFRNONAA 110 12/21/2019 0830   GFRAA 125 09/10/2020 1016   GFRAA 128 12/21/2019 0830   Lab Results  Component Value Date   CHOL 134 11/18/2022   HDL 47 11/18/2022   LDLCALC 68 11/18/2022   TRIG 105 11/18/2022   CHOLHDL 2.9 11/18/2022   Lab Results  Component Value Date   HGBA1C 9.7 (A) 11/20/2022   No results found for: "VITAMINB12" Lab Results  Component Value Date   TSH 2.950 11/18/2022    Jeanmarie Millet, AGNP-C, DNP 08/26/2023, 8:50 AM [facility] [address], [address] [place], [place] [address] [phone]

## 2023-08-26 ENCOUNTER — Ambulatory Visit: Payer: Medicaid Other | Admitting: Neurology

## 2023-08-26 ENCOUNTER — Encounter: Payer: Self-pay | Admitting: Neurology

## 2023-08-26 DIAGNOSIS — G35 Multiple sclerosis: Secondary | ICD-10-CM | POA: Diagnosis not present

## 2023-08-26 DIAGNOSIS — Z5181 Encounter for therapeutic drug level monitoring: Secondary | ICD-10-CM

## 2023-08-26 DIAGNOSIS — R569 Unspecified convulsions: Secondary | ICD-10-CM | POA: Diagnosis not present

## 2023-08-26 MED ORDER — DIVALPROEX SODIUM 500 MG PO DR TAB: DELAYED_RELEASE_TABLET | ORAL | 3 refills | Status: DC

## 2023-08-26 MED ORDER — LEVETIRACETAM 750 MG PO TABS: ORAL_TABLET | ORAL | 3 refills | Status: DC

## 2023-08-26 NOTE — Patient Instructions (Signed)
 Continue current seizure medications, if seizure like spells continue let me know, you are not to drive a car, continue on Aubagio  for MS. Call for any issues

## 2023-08-27 NOTE — Progress Notes (Signed)
Chart reviewed, agree above plan ?

## 2023-08-29 ENCOUNTER — Other Ambulatory Visit: Payer: Self-pay | Admitting: Nurse Practitioner

## 2023-08-29 DIAGNOSIS — E1165 Type 2 diabetes mellitus with hyperglycemia: Secondary | ICD-10-CM

## 2023-09-03 ENCOUNTER — Other Ambulatory Visit: Payer: Self-pay | Admitting: Nurse Practitioner

## 2023-09-03 DIAGNOSIS — E1165 Type 2 diabetes mellitus with hyperglycemia: Secondary | ICD-10-CM

## 2024-01-14 ENCOUNTER — Other Ambulatory Visit: Payer: Self-pay | Admitting: Neurology

## 2024-02-03 ENCOUNTER — Other Ambulatory Visit: Payer: Self-pay | Admitting: Nurse Practitioner

## 2024-02-24 ENCOUNTER — Other Ambulatory Visit: Payer: Self-pay | Admitting: Nurse Practitioner

## 2024-03-08 NOTE — Progress Notes (Deleted)
 Patient: Martin Stevenson Date of Birth: [birth year]  Reason for Visit: Follow up History from: Patient Primary Neurologist: Willis/ Onita  ASSESSMENT AND PLAN 38 y.o. year old male   1.  Relapsing remitting multiple sclerosis -On Aubagio  since September 2024, tolerating well, potentially some peripheral neuropathy symptoms however not clear strictly, Aubagio  as A1c is in the 9's, recently started amlodipine -Plan to check MRI of the brain with and without contrast in 6 months for MS surveillance -Previously treated with Mayzent  but had poor compliance, thus switch to Aubagio  -He was started on Tecfidera  for MS in 2019, switch to Mayzent  August 2021 when MRI showed new lesion, unclear timing of lesion MRI interval was March 2019-February 2021 -Continue gabapentin  600 mg at bedtime for insomnia, achy pains -He has 1 more month of monthly LFTs, thus far have been normal  2.  Complex partial seizure with secondary generalization -Small partial seizure type event less than 5 seconds several months ago.  Historically reports about 1-2 episodes a year.  Wishes to continue current seizure regimen Keppra  750 mg, 2 tablets twice daily, Depakote  DR 500 mg 2/3 tablets daily  -July 2024 Keppra  level 55.9, August 2024 Depakote  level 108 -We discussed VNS, he is not interested.  If seizure events continue would add on Onfi, will draw a trough levels of Keppra  and Depakote  level next month when he goes for MS labs. -He does not have a driver's license, we discussed he is not supposed to drive car especially with seizure history -Previously treated with Dilantin  -Follow-up with me in 6 months or sooner if needed  No orders of the defined types were placed in this encounter.  HISTORY  Martin MooreIs a 37 year old male, following up for Relapsing Remitting Multiple Sclerosis, his primary care physician is Dr. Carlette, Tesfaye he was patient of Dr. Jenel in the past   I reviewed and summarized the  referring note. PMHX. 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 Jenel since February 2019 for recurrent seizure, 2 maternal cousins also have seizure, seizure often preceded by dj vu sensation, then blackout, jerking all fours, with occasionally tongue biting bowel bladder incontinence   He could not tolerate Dilantin in the past, felt like a zombie, he was started on titrating dose of Keppra , Depakote , currently taking Keppra  750 mg 2 tablets twice a day, Depakote  DR 500 mg 2 tablets in the morning, 3 at evening, with current dose, his seizure is overall under good control, only couple times a year he will have a smaller spells, most recent one was in November 2023, he had dj vu sensation, lightheaded, mild confusion, lasting for few minutes, did not spread to generalized convulsion   EEG in May 2019 by Dr. Jenel reported sharp with spike-wave activity at the right frontotemporal region     For workup of his seizure, he had MRI of the brain with without contrast in March 2019, noted periventricular subcortical T2/FLAIR hyperintensity lesions, with the possibility of embolus   Lumbar puncture on November 03, 2017 showed more than 5 oligoclonal banding   MRI of cervical spine in March 2019 showed no cord involvement   He was treated with Tecfidera  in early 2020, JC virus titer was elevated 3.58 in May 2019, will switch to Mayzent  since August 2021 due to repeat MRI of the brain in March 2021 showed new left brainstem  lesion   Patient denies any noticeable flareup, denies gait abnormality, on disability, has 4 children, spent most of the time playing with his children, no driving, denies visual loss,   Personally reviewed most recent MRI of the brain with without contrast March 22, 2021, MS lesions involving left inferior cerebellar peduncle, periventricular and deep white matter, no  contrast-enhancement, no change compared to March 2021  Update February 10, 2023 SS: Has not had Mayzent  for 2 months due to not knowing how to fill out the online application. I ordered MRI brain in August 2023, he never had it. Noticing a few MS symptoms, hands, knees ache, knows off Mayzent . Going to the gym, playing basketball. Is not lazy. Is active. No falls, no balance issues. Some blurry vision, feels needs eye exam. Both arms sometimes feel hard to pick up kids. Has urinary frequency from DM. Seizures doing great, can't even remember last seizure, Depakote  1000/1500 mg, Keppra  1500 mg BID. Takes gabapentin  600 mg at bedtime for sleep, achy pain. Mentions issues with getting to pick the medication up, he doesn't drive, someone has to be home to sign for it. Vitamin D  has been fine, stopped it. 11/20/22 A1C 9.7, TSH 2.950,  Update August 26, 2023 SS: Started Aubagio  September 2024. Had noted some tingling in his feet about a month, started amlodipine around the time, A1C 9's. Reports seizure 3 months ago, stared off, lasted about 5 seconds, had not missed any dose of medication, felt it coming on, under a lot of stress, remains on Keppra  750 mg 2 tablets twice daily, Depakote  DR 500 mg 2/3. Has not missed any doses. PCP follows Vitamin D , told looks good. Feels MS stable.   Update 03/09/24 SS:   REVIEW OF SYSTEMS: Out of a complete 14 system review of symptoms, the patient complains only of the following symptoms, and all other reviewed systems are negative.  See HPI  ALLERGIES: Allergies  Allergen Reactions   Benazepril     States that it made his mouth burn   Shellfish Allergy     HOME MEDICATIONS: Outpatient Medications Prior to Visit  Medication Sig Dispense Refill   amLODipine (NORVASC) 2.5 MG tablet Take 2.5 mg by mouth daily.     atorvastatin  (LIPITOR) 20 MG tablet Take 1 tablet (20 mg total) by mouth daily. 90 tablet 3   Blood Glucose Monitoring Suppl (ACCU-CHEK GUIDE) w/Device  KIT Use to monitor glucose once daily before breakfast 1 kit 0   cholecalciferol (VITAMIN D3) 25 MCG (1000 UNIT) tablet Take 1,000 Units by mouth daily.     divalproex  (DEPAKOTE ) 500 MG DR tablet 2 tablets in the morning, 3 in the evening 450 tablet 3   gabapentin  (NEURONTIN ) 600 MG tablet Take 1 tablet (600 mg total) by mouth at bedtime. 90 tablet 3   glipiZIDE  (GLUCOTROL  XL) 5 MG 24 hr tablet TAKE 2 TABLETS BY MOUTH ONCE DAILY WITH BREAKFAST 180 tablet 0   glucose blood (ACCU-CHEK GUIDE) test strip USE AS DIRECTED TO MONITOR GLUCOSE ONCE DAILY BEFORE BREAKFST. 50 each 0   levETIRAcetam  (KEPPRA ) 750 MG tablet Take 2 tablets twice daily 360 tablet 3   losartan -hydrochlorothiazide  (HYZAAR) 100-25 MG tablet Take 1 tablet by mouth daily. 90 tablet 3   metFORMIN  (GLUCOPHAGE ) 1000 MG tablet Take 1 tablet (1,000 mg total) by mouth 2 (two) times daily with a meal. 180 tablet 3   Teriflunomide  14 MG TABS Take 14mg  (1 tablet) by mouth daily. 30 tablet 11   Vitamin  D, Ergocalciferol , (DRISDOL ) 1.25 MG (50000 UNIT) CAPS capsule Take 1 capsule (50,000 Units total) by mouth once a week. 12 capsule 0   No facility-administered medications prior to visit.    PAST MEDICAL HISTORY: Past Medical History:  Diagnosis Date   Diabetes (HCC)    Fluid retention    Hypertension    Multiple sclerosis (HCC) 12/29/2017   Seizures (HCC)     PAST SURGICAL HISTORY: Past Surgical History:  Procedure Laterality Date   MOUTH SURGERY     x3, jaw, hip, chin    FAMILY HISTORY: Family History  Problem Relation Age of Onset   Diabetes Mother    Pulmonary embolism Mother        hx of blood clots   Diabetes Father     SOCIAL HISTORY: Social History   Socioeconomic History   Marital status: Single    Spouse name: Not on file   Number of children: 4   Years of education: HS   Highest education level: Not on file  Occupational History    Comment: Unemployed  Tobacco Use   Smoking status: Never   Smokeless  tobacco: Never  Vaping Use   Vaping status: Never Used  Substance and Sexual Activity   Alcohol use: Yes    Alcohol/week: 1.0 standard drink of alcohol    Types: 1 Standard drinks or equivalent per week    Comment: Quit: 2012   Drug use: No   Sexual activity: Not Currently  Other Topics Concern   Not on file  Social History Narrative   Pt lives at home with his spouse.   Caffeine Use: very little   Right handed    Social Drivers of Corporate investment banker Strain: Not on file  Food Insecurity: Not on file  Transportation Needs: Not on file  Physical Activity: Not on file  Stress: Not on file  Social Connections: Unknown (12/14/2021)   [facility]   Social Network    Social Network: Not on file  Intimate Partner Violence: Unknown (11/12/2021)   Received from [facility]   HITS    Physically Hurt: Not on file    Insult or Talk Down To: Not on file    Threaten Physical Harm: Not on file    Scream or Curse: Not on file   PHYSICAL EXAM  There were no vitals filed for this visit.   There is no height or weight on file to calculate BMI.  Generalized: Well developed, in no acute distress  Neurological examination  Mentation: Alert oriented to time, place, history taking. Follows all commands speech and language fluent Cranial nerve II-XII: Pupils were equal round reactive to light. Extraocular movements were full, visual field were full on confrontational test. Facial sensation and strength were normal. Head turning and shoulder shrug  were normal and symmetric. Motor: The motor testing reveals 5 over 5 strength of all 4 extremities. Good symmetric motor tone is noted throughout.  Sensory: Sensory testing is intact to soft touch on all 4 extremities. No evidence of extinction is noted.  Coordination: Cerebellar testing reveals good finger-nose-finger and heel-to-shin bilaterally.  Gait and station: Gait is normal. Tandem gait is normal.  Reflexes: Deep  tendon reflexes are symmetric and normal bilaterally.   DIAGNOSTIC DATA (LABS, IMAGING, TESTING) - I reviewed patient records, labs, notes, testing and imaging myself where available.  Lab Results  Component Value Date   WBC 5.9 02/10/2023   HGB 13.5 02/10/2023   HCT  40.6 02/10/2023   MCV 84 02/10/2023   PLT 226 02/10/2023      Component Value Date/Time   NA 139 04/02/2023 1106   K 4.4 04/02/2023 1106   CL 102 04/02/2023 1106   CO2 20 04/02/2023 1106   GLUCOSE 383 (H) 04/02/2023 1106   GLUCOSE 299 (H) 03/12/2021 1120   BUN 10 04/02/2023 1106   CREATININE 0.92 04/02/2023 1106   CREATININE 0.89 03/12/2021 1120   CALCIUM  9.2 04/02/2023 1106   PROT 6.6 08/20/2023 0937   ALBUMIN 4.0 (L) 08/20/2023 0937   AST 13 08/20/2023 0937   ALT 9 08/20/2023 0937   ALKPHOS 52 08/20/2023 0937   BILITOT 0.3 08/20/2023 0937   GFRNONAA 108 09/10/2020 1016   GFRNONAA 110 12/21/2019 0830   GFRAA 125 09/10/2020 1016   GFRAA 128 12/21/2019 0830   Lab Results  Component Value Date   CHOL 134 11/18/2022   HDL 47 11/18/2022   LDLCALC 68 11/18/2022   TRIG 105 11/18/2022   CHOLHDL 2.9 11/18/2022   Lab Results  Component Value Date   HGBA1C 9.7 (A) 11/20/2022   No results found for: [id number] Lab Results  Component Value Date   TSH 2.950 11/18/2022    Lauraine Born, AGNP-C, DNP 03/08/2024, 10:26 AM [facility] [address], [address] [place], [place] [address] [phone]

## 2024-03-09 ENCOUNTER — Ambulatory Visit: Payer: Medicaid Other | Admitting: Neurology

## 2024-03-16 ENCOUNTER — Telehealth: Payer: Self-pay | Admitting: Neurology

## 2024-03-16 NOTE — Telephone Encounter (Signed)
Patient called to reschedule appointment,

## 2024-07-27 ENCOUNTER — Encounter: Payer: Self-pay | Admitting: Neurology

## 2024-07-27 ENCOUNTER — Ambulatory Visit: Admitting: Neurology

## 2024-07-27 VITALS — BP 140/86 | HR 78 | Ht 67.0 in | Wt 189.0 lb

## 2024-07-27 DIAGNOSIS — Z7984 Long term (current) use of oral hypoglycemic drugs: Secondary | ICD-10-CM | POA: Diagnosis not present

## 2024-07-27 DIAGNOSIS — R569 Unspecified convulsions: Secondary | ICD-10-CM

## 2024-07-27 DIAGNOSIS — Z5181 Encounter for therapeutic drug level monitoring: Secondary | ICD-10-CM | POA: Diagnosis not present

## 2024-07-27 DIAGNOSIS — E1165 Type 2 diabetes mellitus with hyperglycemia: Secondary | ICD-10-CM | POA: Diagnosis not present

## 2024-07-27 DIAGNOSIS — G35D Multiple sclerosis, unspecified: Secondary | ICD-10-CM

## 2024-07-27 DIAGNOSIS — E559 Vitamin D deficiency, unspecified: Secondary | ICD-10-CM

## 2024-07-27 MED ORDER — LEVETIRACETAM 750 MG PO TABS: ORAL_TABLET | ORAL | 3 refills | Status: AC

## 2024-07-27 MED ORDER — TERIFLUNOMIDE 14 MG PO TABS: 14.0000 mg | ORAL_TABLET | Freq: Every day | ORAL | 11 refills | Status: AC

## 2024-07-27 MED ORDER — DIVALPROEX SODIUM 500 MG PO DR TAB: DELAYED_RELEASE_TABLET | ORAL | 3 refills | Status: AC

## 2024-07-27 NOTE — Progress Notes (Signed)
 Patient: Martin Stevenson Date of Birth: 1985/10/16  Reason for Visit: Follow up History from: Patient Primary Neurologist: Willis/ Onita  ASSESSMENT AND PLAN 38 y.o. year old male   1.  Relapsing remitting multiple sclerosis -On Aubagio  since September 2024, tolerating well, potentially some peripheral neuropathy symptoms however not clear strictly, will check A1C -MRI of the brain with and without contrast for MS surveillance -Previously treated with Mayzent  but had poor compliance, thus switch to Aubagio  -He was started on Tecfidera  for MS in 2019, switch to Mayzent  August 2021 when MRI showed new lesion, unclear timing of lesion MRI interval was March 2019-February 2021 -On gabapentin  600 mg twice a day for paresthesia -Check labs today, CBC, CMP, vitamin D , B12, A1C  2.  Complex partial seizure with secondary generalization -Small partial seizure type event 2 months ago in the setting of 2 days of missed medication. Historically reports about 1-2 episodes a year - Continue Keppra  750 mg, 2 tablets twice a day, Depakote  DR 500 mg, 2 tablets AM/3 tablets PM -Next Steps: He is not interested in VNS, may consider Onfi -He does not have a driver's license, we discussed he is not supposed to drive car especially with seizure history -Previously treated with Dilantin  -Follow-up with Dr. Onita in 6 months to rotate every few visits with me  Meds ordered this encounter  Medications   levETIRAcetam  (KEPPRA ) 750 MG tablet    Sig: Take 2 tablets twice daily    Dispense:  360 tablet    Refill:  3    Please consider 90 day supplies to promote better adherence   Teriflunomide  14 MG TABS    Sig: Take 1 tablet (14 mg total) by mouth daily.    Dispense:  30 tablet    Refill:  11   divalproex  (DEPAKOTE ) 500 MG DR tablet    Sig: 2 tablets in the morning, 3 in the evening    Dispense:  450 tablet    Refill:  3   HISTORY  Martin MooreIs a 38 year old male, following up for Relapsing  Remitting Multiple Sclerosis, his primary care physician is Dr. Carlette, Tesfaye he was patient of Dr. Jenel in the past   I reviewed and summarized the referring note. PMHX. 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 Jenel since February 2019 for recurrent seizure, 2 maternal cousins also have seizure, seizure often preceded by dj vu sensation, then blackout, jerking all fours, with occasionally tongue biting bowel bladder incontinence   He could not tolerate Dilantin in the past, felt like a zombie, he was started on titrating dose of Keppra , Depakote , currently taking Keppra  750 mg 2 tablets twice a day, Depakote  DR 500 mg 2 tablets in the morning, 3 at evening, with current dose, his seizure is overall under good control, only couple times a year he will have a smaller spells, most recent one was in November 2023, he had dj vu sensation, lightheaded, mild confusion, lasting for few minutes, did not spread to generalized convulsion   EEG in May 2019 by Dr. Jenel reported sharp with spike-wave activity at the right frontotemporal region     For workup of his seizure, he had MRI of the brain with without contrast in March 2019, noted periventricular subcortical T2/FLAIR hyperintensity lesions, with the possibility of embolus   Lumbar puncture on November 03, 2017 showed more  than 5 oligoclonal banding   MRI of cervical spine in March 2019 showed no cord involvement   He was treated with Tecfidera  in early 2020, JC virus titer was elevated 3.58 in May 2019, will switch to Mayzent  since August 2021 due to repeat MRI of the brain in March 2021 showed new left brainstem lesion   Patient denies any noticeable flareup, denies gait abnormality, on disability, has 4 children, spent most of the time playing with his children, no driving, denies visual loss,   Personally reviewed most recent MRI  of the brain with without contrast March 22, 2021, MS lesions involving left inferior cerebellar peduncle, periventricular and deep white matter, no contrast-enhancement, no change compared to March 2021  Update February 10, 2023 SS: Has not had Mayzent  for 2 months due to not knowing how to fill out the online application. I ordered MRI brain in August 2023, he never had it. Noticing a few MS symptoms, hands, knees ache, knows off Mayzent . Going to the gym, playing basketball. Is not lazy. Is active. No falls, no balance issues. Some blurry vision, feels needs eye exam. Both arms sometimes feel hard to pick up kids. Has urinary frequency from DM. Seizures doing great, can't even remember last seizure, Depakote  1000/1500 mg, Keppra  1500 mg BID. Takes gabapentin  600 mg at bedtime for sleep, achy pain. Mentions issues with getting to pick the medication up, he doesn't drive, someone has to be home to sign for it. Vitamin D  has been fine, stopped it. 11/20/22 A1C 9.7, TSH 2.950,  Update August 26, 2023 SS: Started Aubagio  September 2024. Had noted some tingling in his feet about a month, started amlodipine around the time, A1C 9's. Reports seizure 3 months ago, stared off, lasted about 5 seconds, had not missed any dose of medication, felt it coming on, under a lot of stress, remains on Keppra  750 mg 2 tablets twice daily, Depakote  DR 500 mg 2/3. Has not missed any doses. PCP follows Vitamin D , told looks good. Feels MS stable.   Update 07/27/24 SS: Remains on teriflunomide .  Notices pins-and-needles in his feet.  Walking and balance is fine.  No muscle weakness.  Vision is good.  Last A1c several months ago was 8, would like rechecked.  Last seizure was 2 months ago, he stared off, had missed 2 days of Keppra  and Depakote .  He does not drive.   REVIEW OF SYSTEMS: Out of a complete 14 system review of symptoms, the patient complains only of the following symptoms, and all other reviewed systems are  negative.  See HPI  ALLERGIES: Allergies  Allergen Reactions   Benazepril     States that it made his mouth burn   Shellfish Allergy     HOME MEDICATIONS: Outpatient Medications Prior to Visit  Medication Sig Dispense Refill   amLODipine (NORVASC) 2.5 MG tablet Take 2.5 mg by mouth daily. (Patient taking differently: Take 5 mg by mouth daily.)     atorvastatin  (LIPITOR) 20 MG tablet Take 1 tablet (20 mg total) by mouth daily. 90 tablet 3   Blood Glucose Monitoring Suppl (ACCU-CHEK GUIDE) w/Device KIT Use to monitor glucose once daily before breakfast 1 kit 0   cholecalciferol (VITAMIN D3) 25 MCG (1000 UNIT) tablet Take 1,000 Units by mouth daily.     gabapentin  (NEURONTIN ) 600 MG tablet Take 1 tablet (600 mg total) by mouth at bedtime. (Patient taking differently: Take 600 mg by mouth 2 (two) times daily.) 90 tablet 3   glipiZIDE  (  GLUCOTROL  XL) 5 MG 24 hr tablet TAKE 2 TABLETS BY MOUTH ONCE DAILY WITH BREAKFAST 180 tablet 0   glucose blood (ACCU-CHEK GUIDE) test strip USE AS DIRECTED TO MONITOR GLUCOSE ONCE DAILY BEFORE BREAKFST. 50 each 0   losartan -hydrochlorothiazide  (HYZAAR) 100-25 MG tablet Take 1 tablet by mouth daily. 90 tablet 3   metFORMIN  (GLUCOPHAGE ) 1000 MG tablet Take 1 tablet (1,000 mg total) by mouth 2 (two) times daily with a meal. 180 tablet 3   Vitamin D , Ergocalciferol , (DRISDOL ) 1.25 MG (50000 UNIT) CAPS capsule Take 1 capsule (50,000 Units total) by mouth once a week. 12 capsule 0   divalproex  (DEPAKOTE ) 500 MG DR tablet 2 tablets in the morning, 3 in the evening 450 tablet 3   levETIRAcetam  (KEPPRA ) 750 MG tablet Take 2 tablets twice daily 360 tablet 3   Teriflunomide  14 MG TABS Take 14mg  (1 tablet) by mouth daily. 30 tablet 11   No facility-administered medications prior to visit.    PAST MEDICAL HISTORY: Past Medical History:  Diagnosis Date   Diabetes (HCC)    Fluid retention    Hypertension    Multiple sclerosis 12/29/2017   Seizures (HCC)     PAST  SURGICAL HISTORY: Past Surgical History:  Procedure Laterality Date   MOUTH SURGERY     x3, jaw, hip, chin    FAMILY HISTORY: Family History  Problem Relation Age of Onset   Diabetes Mother    Pulmonary embolism Mother        hx of blood clots   Diabetes Father     SOCIAL HISTORY: Social History   Socioeconomic History   Marital status: Single    Spouse name: Not on file   Number of children: 4   Years of education: HS   Highest education level: Not on file  Occupational History    Comment: Unemployed  Tobacco Use   Smoking status: Never   Smokeless tobacco: Never  Vaping Use   Vaping status: Never Used  Substance and Sexual Activity   Alcohol use: Yes    Alcohol/week: 1.0 standard drink of alcohol    Types: 1 Standard drinks or equivalent per week    Comment: Quit: 2012   Drug use: No   Sexual activity: Not Currently  Other Topics Concern   Not on file  Social History Narrative   Pt lives at home with his spouse.   Caffeine Use: very little   Right handed    Social Drivers of Health   Tobacco Use: Low Risk (07/27/2024)   Patient History    Smoking Tobacco Use: Never    Smokeless Tobacco Use: Never    Passive Exposure: Not on file  Financial Resource Strain: Not on file  Food Insecurity: Not on file  Transportation Needs: Not on file  Physical Activity: Not on file  Stress: Not on file  Social Connections: Unknown (12/14/2021)   Received from [facility]   Social Network    Social Network: Not on file  Intimate Partner Violence: Unknown (11/12/2021)   Received from [facility]   HITS    Physically Hurt: Not on file    Insult or Talk Down To: Not on file    Threaten Physical Harm: Not on file    Scream or Curse: Not on file  Depression (EYV7-0): Not on file  Alcohol Screen: Not on file  Housing: Not on file  Utilities: Not on file  Health Literacy: Not on file   PHYSICAL EXAM  Vitals:  07/27/24 0757  BP: (!) 140/86  Pulse: 78  SpO2: 99%   Weight: 189 lb (85.7 kg)  Height: 5' 7 (1.702 m)   Body mass index is 29.6 kg/m.  Generalized: Well developed, in no acute distress  Neurological examination  Mentation: Alert oriented to time, place, history taking. Follows all commands speech and language fluent Cranial nerve II-XII: Pupils were equal round reactive to light. Extraocular movements were full, visual field were full on confrontational test. Facial sensation and strength were normal. Head turning and shoulder shrug  were normal and symmetric. Motor: The motor testing reveals 5 over 5 strength of all 4 extremities. Good symmetric motor tone is noted throughout.  Sensory: Decreased soft touch/pinprick sensation to midfoot bilaterally  Coordination: Cerebellar testing reveals good finger-nose-finger and heel-to-shin bilaterally.  Gait and station: Gait is normal. Tandem gait is normal.  Difficulty with toe walking. Reflexes: Deep tendon reflexes are symmetric and normal bilaterally.   DIAGNOSTIC DATA (LABS, IMAGING, TESTING) - I reviewed patient records, labs, notes, testing and imaging myself where available.  Lab Results  Component Value Date   WBC 5.9 02/10/2023   HGB 13.5 02/10/2023   HCT 40.6 02/10/2023   MCV 84 02/10/2023   PLT 226 02/10/2023      Component Value Date/Time   NA 139 04/02/2023 1106   K 4.4 04/02/2023 1106   CL 102 04/02/2023 1106   CO2 20 04/02/2023 1106   GLUCOSE 383 (H) 04/02/2023 1106   GLUCOSE 299 (H) 03/12/2021 1120   BUN 10 04/02/2023 1106   CREATININE 0.92 04/02/2023 1106   CREATININE 0.89 03/12/2021 1120   CALCIUM  9.2 04/02/2023 1106   PROT 6.6 08/20/2023 0937   ALBUMIN 4.0 (L) 08/20/2023 0937   AST 13 08/20/2023 0937   ALT 9 08/20/2023 0937   ALKPHOS 52 08/20/2023 0937   BILITOT 0.3 08/20/2023 0937   GFRNONAA 108 09/10/2020 1016   GFRNONAA 110 12/21/2019 0830   GFRAA 125 09/10/2020 1016   GFRAA 128 12/21/2019 0830   Lab Results  Component Value Date   CHOL 134  11/18/2022   HDL 47 11/18/2022   LDLCALC 68 11/18/2022   TRIG 105 11/18/2022   CHOLHDL 2.9 11/18/2022   Lab Results  Component Value Date   HGBA1C 9.7 (A) 11/20/2022   No results found for: [id number] Lab Results  Component Value Date   TSH 2.950 11/18/2022    Lauraine Born, AGNP-C, DNP 07/27/2024, 9:02 AM [facility] [address] [place], [place] [address] [phone]

## 2024-07-27 NOTE — Patient Instructions (Signed)
 Great to see you today! Check MRI of the brain Check labs today Continue current medications, do not miss seizure medications Call for seizure activity, follow-up 6 months.  Thanks!!

## 2024-07-29 LAB — CBC WITH DIFFERENTIAL/PLATELET
Basophils Absolute: 0 x10E3/uL (ref 0.0–0.2)
Basos: 1 %
EOS (ABSOLUTE): 0.1 x10E3/uL (ref 0.0–0.4)
Eos: 3 %
Hematocrit: 43.6 % (ref 37.5–51.0)
Hemoglobin: 13.9 g/dL (ref 13.0–17.7)
Immature Grans (Abs): 0 x10E3/uL (ref 0.0–0.1)
Immature Granulocytes: 0 %
Lymphocytes Absolute: 2.2 x10E3/uL (ref 0.7–3.1)
Lymphs: 51 %
MCH: 27.5 pg (ref 26.6–33.0)
MCHC: 31.9 g/dL (ref 31.5–35.7)
MCV: 86 fL (ref 79–97)
Monocytes Absolute: 0.4 x10E3/uL (ref 0.1–0.9)
Monocytes: 10 %
Neutrophils Absolute: 1.5 x10E3/uL (ref 1.4–7.0)
Neutrophils: 35 %
Platelets: 283 x10E3/uL (ref 150–450)
RBC: 5.06 x10E6/uL (ref 4.14–5.80)
RDW: 14.7 % (ref 11.6–15.4)
WBC: 4.2 x10E3/uL (ref 3.4–10.8)

## 2024-07-29 LAB — COMPREHENSIVE METABOLIC PANEL WITH GFR
ALT: 12 IU/L (ref 0–44)
AST: 13 IU/L (ref 0–40)
Albumin: 4.5 g/dL (ref 4.1–5.1)
Alkaline Phosphatase: 48 IU/L (ref 47–123)
BUN/Creatinine Ratio: 16 (ref 9–20)
BUN: 13 mg/dL (ref 6–24)
Bilirubin Total: 0.3 mg/dL (ref 0.0–1.2)
CO2: 25 mmol/L (ref 20–29)
Calcium: 10 mg/dL (ref 8.7–10.2)
Chloride: 98 mmol/L (ref 96–106)
Creatinine, Ser: 0.8 mg/dL (ref 0.76–1.27)
Globulin, Total: 2.6 g/dL (ref 1.5–4.5)
Glucose: 132 mg/dL — ABNORMAL HIGH (ref 70–99)
Potassium: 3.4 mmol/L — ABNORMAL LOW (ref 3.5–5.2)
Sodium: 140 mmol/L (ref 134–144)
Total Protein: 7.1 g/dL (ref 6.0–8.5)
eGFR: 115 mL/min/1.73 (ref >59)

## 2024-07-29 LAB — HGB A1C W/O EAG: Hgb A1c MFr Bld: 9.8 % — ABNORMAL HIGH (ref 4.8–5.6)

## 2024-07-29 LAB — VITAMIN D 25 HYDROXY (VIT D DEFICIENCY, FRACTURES): Vit D, 25-Hydroxy: 17 ng/mL — ABNORMAL LOW (ref 30.0–100.0)

## 2024-07-29 LAB — VALPROIC ACID LEVEL: Valproic Acid Lvl: 102 ug/mL — ABNORMAL HIGH (ref 50–100)

## 2024-07-29 LAB — VITAMIN B12: Vitamin B-12: 279 pg/mL (ref 232–1245)

## 2024-07-29 LAB — LEVETIRACETAM LEVEL: Levetiracetam Lvl: 30.6 ug/mL (ref 10.0–40.0)

## 2024-08-02 ENCOUNTER — Telehealth: Payer: Self-pay | Admitting: Neurology

## 2024-08-02 MED ORDER — VITAMIN D (ERGOCALCIFEROL) 1.25 MG (50000 UNIT) PO CAPS: 50000.0000 [IU] | ORAL_CAPSULE | ORAL | 0 refills | Status: AC

## 2024-08-02 NOTE — Telephone Encounter (Signed)
 Called and spoke to pt. Relayed results. Routing to debra settle to fax result

## 2024-08-02 NOTE — Telephone Encounter (Signed)
 Labs show depakote  level 102, Keppra  30, B 12 low end of normal at 279, A1C poorly controlled at 9.8, Vitamin D  low at 17.   I called, needs to follow up with his PCP. I will order Vitamin D  50,000 units weekly x 8 weeks then 2000 units daily. Recheck with PCP. B12 is low normal may benefit from OTC supplement 1,000 mcg daily given paresthesia symptoms, however likely are from poorly controlled diabetes.   Please fax labs to PCP. I asked him to call and schedule visits with PCP to review labs. Thanks

## 2024-08-03 ENCOUNTER — Telehealth: Payer: Self-pay | Admitting: Neurology

## 2024-08-03 NOTE — Telephone Encounter (Signed)
 MCD shara: [id number] exp. 08/03/24-09/02/24 Scheduled at GI on 08/31/24

## 2024-08-31 ENCOUNTER — Inpatient Hospital Stay: Admission: RE | Admit: 2024-08-31 | Discharge: 2024-08-31 | Attending: Neurology | Admitting: Neurology

## 2024-08-31 DIAGNOSIS — G35D Multiple sclerosis, unspecified: Secondary | ICD-10-CM | POA: Diagnosis not present

## 2024-08-31 MED ORDER — GADOPICLENOL 0.5 MMOL/ML IV SOLN
8.5000 mL | Freq: Once | INTRAVENOUS | Status: AC | PRN
  Administered 2024-08-31: 8.5 mL via INTRAVENOUS

## 2024-09-01 ENCOUNTER — Ambulatory Visit: Payer: Self-pay | Admitting: Neurology

## 2024-10-13 ENCOUNTER — Ambulatory Visit: Admitting: Neurology

## 2025-01-31 ENCOUNTER — Ambulatory Visit: Admitting: Neurology
